# Patient Record
Sex: Male | Born: 1940 | Race: White | Hispanic: No | Marital: Married | Smoking: Never smoker
Health system: Southern US, Community
[De-identification: ages and names within clinical notes are randomized; demographics above are authoritative.]

## PROBLEM LIST (undated history)

## (undated) DIAGNOSIS — T7840XA Allergy, unspecified, initial encounter: Secondary | ICD-10-CM

## (undated) DIAGNOSIS — E119 Type 2 diabetes mellitus without complications: Secondary | ICD-10-CM

## (undated) DIAGNOSIS — M1990 Unspecified osteoarthritis, unspecified site: Secondary | ICD-10-CM

## (undated) DIAGNOSIS — E785 Hyperlipidemia, unspecified: Secondary | ICD-10-CM

## (undated) DIAGNOSIS — I10 Essential (primary) hypertension: Secondary | ICD-10-CM

## (undated) DIAGNOSIS — I82511 Chronic embolism and thrombosis of right femoral vein: Secondary | ICD-10-CM

## (undated) HISTORY — DX: Allergy, unspecified, initial encounter: T78.40XA

## (undated) HISTORY — DX: Unspecified osteoarthritis, unspecified site: M19.90

## (undated) HISTORY — DX: Essential (primary) hypertension: I10

## (undated) HISTORY — DX: Type 2 diabetes mellitus without complications: E11.9

## (undated) HISTORY — DX: Hyperlipidemia, unspecified: E78.5

## (undated) HISTORY — DX: Chronic embolism and thrombosis of right femoral vein: I82.511

---

## 1968-06-03 HISTORY — PX: NASAL SEPTUM SURGERY: SHX37

## 1985-06-03 HISTORY — PX: LUMBAR DISC SURGERY: SHX700

## 1996-06-03 HISTORY — PX: KNEE ARTHROSCOPY: SUR90

## 1997-06-03 HISTORY — PX: BUNIONECTOMY: SHX129

## 1998-06-03 HISTORY — PX: BLADDER STONE REMOVAL: SHX568

## 1998-07-18 ENCOUNTER — Ambulatory Visit (HOSPITAL_COMMUNITY): Admission: RE | Admit: 1998-07-18 | Discharge: 1998-07-18 | Payer: Self-pay | Admitting: Orthopedic Surgery

## 1998-07-18 ENCOUNTER — Encounter: Payer: Self-pay | Admitting: Orthopedic Surgery

## 2000-06-03 HISTORY — PX: PROSTATE SURGERY: SHX751

## 2001-06-03 HISTORY — PX: HERNIA REPAIR: SHX51

## 2009-06-03 HISTORY — PX: OTHER SURGICAL HISTORY: SHX169

## 2011-01-08 ENCOUNTER — Ambulatory Visit (INDEPENDENT_AMBULATORY_CARE_PROVIDER_SITE_OTHER): Payer: Medicare Other | Admitting: Family Medicine

## 2011-01-08 ENCOUNTER — Encounter: Payer: Self-pay | Admitting: Family Medicine

## 2011-01-08 DIAGNOSIS — I10 Essential (primary) hypertension: Secondary | ICD-10-CM

## 2011-01-08 DIAGNOSIS — J309 Allergic rhinitis, unspecified: Secondary | ICD-10-CM

## 2011-01-08 DIAGNOSIS — M8580 Other specified disorders of bone density and structure, unspecified site: Secondary | ICD-10-CM | POA: Insufficient documentation

## 2011-01-08 DIAGNOSIS — M1990 Unspecified osteoarthritis, unspecified site: Secondary | ICD-10-CM | POA: Insufficient documentation

## 2011-01-08 DIAGNOSIS — K219 Gastro-esophageal reflux disease without esophagitis: Secondary | ICD-10-CM | POA: Insufficient documentation

## 2011-01-08 DIAGNOSIS — M949 Disorder of cartilage, unspecified: Secondary | ICD-10-CM

## 2011-01-08 DIAGNOSIS — Z0000 Encounter for general adult medical examination without abnormal findings: Secondary | ICD-10-CM | POA: Insufficient documentation

## 2011-01-08 LAB — CBC WITH DIFFERENTIAL/PLATELET
Basophils Absolute: 0 10*3/uL (ref 0.0–0.1)
Eosinophils Relative: 1.8 % (ref 0.0–5.0)
Hemoglobin: 15.3 g/dL (ref 13.0–17.0)
Lymphocytes Relative: 23.4 % (ref 12.0–46.0)
Monocytes Relative: 7.4 % (ref 3.0–12.0)
Neutro Abs: 4.8 10*3/uL (ref 1.4–7.7)
RDW: 13.6 % (ref 11.5–14.6)
WBC: 7.2 10*3/uL (ref 4.5–10.5)

## 2011-01-08 LAB — BASIC METABOLIC PANEL
CO2: 24 mEq/L (ref 19–32)
Chloride: 103 mEq/L (ref 96–112)
Creatinine, Ser: 1.5 mg/dL (ref 0.4–1.5)
Glucose, Bld: 99 mg/dL (ref 70–99)
Sodium: 138 mEq/L (ref 135–145)

## 2011-01-08 LAB — HEPATIC FUNCTION PANEL
ALT: 19 U/L (ref 0–53)
Alkaline Phosphatase: 82 U/L (ref 39–117)
Bilirubin, Direct: 0 mg/dL (ref 0.0–0.3)
Total Protein: 7.4 g/dL (ref 6.0–8.3)

## 2011-01-08 LAB — LIPID PANEL: Total CHOL/HDL Ratio: 6

## 2011-01-08 NOTE — Progress Notes (Signed)
  Subjective:    Patient ID: Allen Jones, male    DOB: 1940-12-21, 70 y.o.   MRN: [id number]  HPI New to establish care.  Previous MD- Debroah Loop at [facility] (left practice), saw Dr Olena Leatherwood (left practice) and then Dr Brendolyn Patty at [facility].  HTN- chronic problem for pt, well controlled on Benicar and HCTZ.  No CP, SOB, HAs, visual changes, edema.  Osteoarthritis- chronic problem for pt.  Has had back and knee surgery.  takes Celebrex daily w/ good results.  ostenopenia- had bone density done in March and was dx'd w/ osteopenia.  Started on Oscal w/ D daily.  Exercises regularly 2-5x/week.  GERD- chronic problem for pt, well controlled on Prilosec 40mg  daily.  Denies breakthrough sxs.  Seasonal allergies- chronic problem for pt, using Flonase regularly.  Still some mild PND and congestion.   Review of Systems For ROS see HPI     Objective:   Physical Exam  Vitals reviewed. Constitutional: He is oriented to person, place, and time. He appears well-developed and well-nourished. No distress.  HENT:  Head: Normocephalic and atraumatic.       Mildly edematous nasal turbinates TMs normal bilaterally Mild PND  Eyes: Conjunctivae and EOM are normal. Pupils are equal, round, and reactive to light.  Neck: Normal range of motion. Neck supple. No thyromegaly present.  Cardiovascular: Normal rate, regular rhythm, normal heart sounds and intact distal pulses.   No murmur heard. Pulmonary/Chest: Effort normal and breath sounds normal. No respiratory distress.  Abdominal: Soft. Bowel sounds are normal. He exhibits no distension.  Musculoskeletal: He exhibits no edema.  Lymphadenopathy:    He has no cervical adenopathy.  Neurological: He is alert and oriented to person, place, and time. No cranial nerve deficit.  Skin: Skin is warm and dry.  Psychiatric: He has a normal mood and affect. His behavior is normal.          Assessment & Plan:

## 2011-01-08 NOTE — Patient Instructions (Signed)
Please schedule your complete physical at your convenience We'll notify you of your lab results Call with any questions or concerns Welcome!  We're glad to have you!

## 2011-01-09 ENCOUNTER — Telehealth: Payer: Self-pay | Admitting: *Deleted

## 2011-01-09 DIAGNOSIS — E785 Hyperlipidemia, unspecified: Secondary | ICD-10-CM

## 2011-01-09 LAB — PSA: PSA: 1.5 ng/mL (ref <=4.00)

## 2011-01-09 MED ORDER — FENOFIBRATE 160 MG PO TABS: 160.0000 mg | ORAL_TABLET | Freq: Every day | ORAL | Status: DC

## 2011-01-09 NOTE — Telephone Encounter (Signed)
Message copied by Romualdo Bolk on Wed Jan 09, 2011  9:14 AM ------      Message from: Sheliah Hatch      Created: Tue Jan 08, 2011  8:34 PM       Triglycerides are elevated- needs to start fenofibrate 160mg  daily and recheck LFTs in 6-8 weeks.            Total cholesterol is also elevated- this will improve w/ attention to healthy diet and continued regular exercise.  Will repeat labs in 6 months.            Kidney function is slightly elevated- should increase water intake            Remainder of labs look good.

## 2011-01-09 NOTE — Telephone Encounter (Signed)
Left message to call back  

## 2011-01-09 NOTE — Telephone Encounter (Signed)
Left message on machine about results. Rx sent to [facility]. Order placed in epic for labs.

## 2011-01-10 ENCOUNTER — Telehealth: Payer: Self-pay

## 2011-01-10 LAB — VITAMIN D 1,25 DIHYDROXY
Vitamin D 1, 25 (OH)2 Total: 30 pg/mL (ref 18–72)
Vitamin D2 1, 25 (OH)2: <8 pg/mL

## 2011-01-10 NOTE — Telephone Encounter (Signed)
Left message on personally identified voicemail to notify pt labs nl 

## 2011-01-10 NOTE — Telephone Encounter (Signed)
Message copied by Beverely Low on Thu Jan 10, 2011  4:50 PM ------      Message from: Sheliah Hatch      Created: Thu Jan 10, 2011  4:37 PM       Normal Vit D level.

## 2011-01-13 NOTE — Assessment & Plan Note (Signed)
Had bone density earlier this year- will be due again in 2014.  Now on daily Ca+D supplement.  Doing weight bearing exercises.  Will follow.

## 2011-01-13 NOTE — Assessment & Plan Note (Signed)
Chronic problem for pt.  Good relief w/ daily NSAID.  On PPI to prevent stomach erosion.

## 2011-01-13 NOTE — Assessment & Plan Note (Signed)
Fairly well controlled on current meds.  Asymptomatic.  Check labs.  No changes.  Will follow.

## 2011-01-13 NOTE — Assessment & Plan Note (Signed)
Well controlled on current meds.  Asymptomatic.  No changes

## 2011-01-13 NOTE — Assessment & Plan Note (Signed)
Using nasal spray regularly but still having some breakthrough sxs.  Encouraged him to start OTC antihistamine.  Pt expressed understanding and is in agreement w/ plan.

## 2011-03-15 ENCOUNTER — Ambulatory Visit (INDEPENDENT_AMBULATORY_CARE_PROVIDER_SITE_OTHER): Payer: Medicare Other | Admitting: Family Medicine

## 2011-03-15 ENCOUNTER — Encounter: Payer: Self-pay | Admitting: Family Medicine

## 2011-03-15 DIAGNOSIS — Z0000 Encounter for general adult medical examination without abnormal findings: Secondary | ICD-10-CM

## 2011-03-15 DIAGNOSIS — Z23 Encounter for immunization: Secondary | ICD-10-CM

## 2011-03-15 DIAGNOSIS — I10 Essential (primary) hypertension: Secondary | ICD-10-CM

## 2011-03-15 DIAGNOSIS — E781 Pure hyperglyceridemia: Secondary | ICD-10-CM

## 2011-03-15 MED ORDER — OMEPRAZOLE MAGNESIUM 20 MG PO TBEC: 20.0000 mg | DELAYED_RELEASE_TABLET | Freq: Two times a day (BID) | ORAL | Status: DC

## 2011-03-15 MED ORDER — CELECOXIB 100 MG PO CAPS: 100.0000 mg | ORAL_CAPSULE | Freq: Two times a day (BID) | ORAL | Status: DC

## 2011-03-15 MED ORDER — OLMESARTAN MEDOXOMIL 40 MG PO TABS: 40.0000 mg | ORAL_TABLET | Freq: Every day | ORAL | Status: DC

## 2011-03-15 MED ORDER — HYDROCHLOROTHIAZIDE 12.5 MG PO TABS: 12.5000 mg | ORAL_TABLET | Freq: Every day | ORAL | Status: DC

## 2011-03-15 MED ORDER — KETOCONAZOLE 2 % EX CREA: TOPICAL_CREAM | Freq: Every day | CUTANEOUS | Status: DC

## 2011-03-15 NOTE — Progress Notes (Signed)
  Subjective:    Patient ID: Allen Jones, male    DOB: December 22, 1940, 70 y.o.   MRN: [id number]  HPI Here today for CPE.  Risk Factors: HTN- chronic problem, well controlled on HCTZ, benicar. Hypertriglyceridemia- new for pt, did not start fenofibrate as recommended.  Exercising regularly, making healthy food choices.  Prefers to wait 6 months and recheck  Physical Activity: exercising 5x/week at the gym Fall risk: low risk Depression: denies current sxs Hearing: normal to conversational tones, slightly decreased to whispered voice at 6 ft ADL's: independent Cognitive: normal linear thought process, memory and attention intact Home Safety: safe at home, lives w/ wife Height, Weight, BMI, Visual Acuity: see vitals, vision corrected to 20/20 w/ glasses Counseling: UTD on colonoscopy (2008), Pneumovax. Labs Ordered: See A&P Care Plan: See A&P    Review of Systems Patient reports no vision/hearing changes, anorexia, fever ,adenopathy, persistant/recurrent hoarseness, swallowing issues, chest pain, palpitations, edema, persistant/recurrent cough, hemoptysis, dyspnea (rest,exertional, paroxysmal nocturnal), gastrointestinal  bleeding (melena, rectal bleeding), abdominal pain, excessive heart burn, GU symptoms (dysuria, hematuria, voiding/incontinence issues) syncope, focal weakness, memory loss, numbness & tingling, skin/hair/nail changes, depression, anxiety, abnormal bruising/bleeding, musculoskeletal symptoms/signs.     Objective:   Physical Exam BP 125/65  Pulse 65  Temp(Src) 97.5 F (36.4 C) (Oral)  Ht 5' 8.5" (1.74 m)  Wt 176 lb (79.833 kg)  BMI 26.37 kg/m2  General Appearance:    Alert, cooperative, no distress, appears stated age  Head:    Normocephalic, without obvious abnormality, atraumatic  Eyes:    PERRL, conjunctiva/corneas clear, EOM's intact, fundi    benign, both eyes       Ears:    Normal TM's and external ear canals, both ears  Nose:   Nares normal, septum midline,  mucosa normal, no drainage   or sinus tenderness  Throat:   Lips, mucosa, and tongue normal; teeth and gums normal  Neck:   Supple, symmetrical, trachea midline, no adenopathy;       thyroid:  No enlargement/tenderness/nodules  Back:     Symmetric, no curvature, ROM normal, no CVA tenderness  Lungs:     Clear to auscultation bilaterally, respirations unlabored  Chest wall:    No tenderness or deformity  Heart:    Regular rate and rhythm, S1 and S2 normal, no murmur, rub   or gallop  Abdomen:     Soft, non-tender, bowel sounds active all four quadrants,    no masses, no organomegaly  Genitalia:    Normal male without lesion, discharge or tenderness  Rectal:    Normal tone, normal prostate, no masses or tenderness;   guaiac negative stool  Extremities:   Extremities normal, atraumatic, no cyanosis or edema  Pulses:   2+ and symmetric all extremities  Skin:   Skin color, texture, turgor normal, no rashes or lesions  Lymph nodes:   Cervical, supraclavicular, and axillary nodes normal  Neurologic:   CNII-XII intact. Normal strength, sensation and reflexes      throughout          Assessment & Plan:

## 2011-03-15 NOTE — Patient Instructions (Signed)
Follow up in 6 months to recheck cholesterol and blood pressure- do not eat before this appt Keep up the good work on diet and exercise Call with any questions or concerns Happy Holidays!

## 2011-03-19 ENCOUNTER — Encounter: Payer: Self-pay | Admitting: Family Medicine

## 2011-03-19 DIAGNOSIS — E781 Pure hyperglyceridemia: Secondary | ICD-10-CM | POA: Insufficient documentation

## 2011-03-19 NOTE — Assessment & Plan Note (Signed)
Pt dx'd at last visit.  Recommended he start fenofibrate but pt did not pick up meds.  He prefers to work on diet and exercise for next 6 months and reassess.  Will follow closely.

## 2011-03-19 NOTE — Assessment & Plan Note (Signed)
Pt's PE WNL.  UTD on health maintenance.  EKG done- see document for interpretation.  Labs from last visit reviewed.  Anticipatory guidance provided.

## 2011-03-19 NOTE — Assessment & Plan Note (Signed)
Chronic problem.  Well controlled today.  Asymptomatic.  No changes at this time.

## 2011-03-22 ENCOUNTER — Other Ambulatory Visit: Payer: Self-pay | Admitting: *Deleted

## 2011-03-22 MED ORDER — OMEPRAZOLE 40 MG PO CPDR: 40.0000 mg | DELAYED_RELEASE_CAPSULE | Freq: Every day | ORAL | Status: DC

## 2011-03-22 NOTE — Telephone Encounter (Signed)
Pt called and wanted to change omeprazole 20mg  BID to Omeprazole 40mg  QD, MD authorized change and new order was faxed to [facility], pt aware that new RX has been faxed

## 2011-05-10 ENCOUNTER — Ambulatory Visit (INDEPENDENT_AMBULATORY_CARE_PROVIDER_SITE_OTHER): Payer: Medicare Other | Admitting: Family

## 2011-05-10 ENCOUNTER — Encounter: Payer: Self-pay | Admitting: Family

## 2011-05-10 ENCOUNTER — Ambulatory Visit (HOSPITAL_BASED_OUTPATIENT_CLINIC_OR_DEPARTMENT_OTHER)
Admission: RE | Admit: 2011-05-10 | Discharge: 2011-05-10 | Disposition: A | Discharge status: Home / Self Care | Payer: Medicare Other | Source: Ambulatory Visit | Attending: Family | Admitting: Family

## 2011-05-10 ENCOUNTER — Telehealth: Payer: Self-pay | Admitting: Family

## 2011-05-10 VITALS — BP 110/70 | HR 72 | Temp 97.5°F | Resp 20 | Wt 178.0 lb

## 2011-05-10 DIAGNOSIS — R61 Generalized hyperhidrosis: Secondary | ICD-10-CM | POA: Insufficient documentation

## 2011-05-10 DIAGNOSIS — R05 Cough: Secondary | ICD-10-CM | POA: Insufficient documentation

## 2011-05-10 DIAGNOSIS — R059 Cough, unspecified: Secondary | ICD-10-CM | POA: Insufficient documentation

## 2011-05-10 DIAGNOSIS — J40 Bronchitis, not specified as acute or chronic: Secondary | ICD-10-CM

## 2011-05-10 DIAGNOSIS — R509 Fever, unspecified: Secondary | ICD-10-CM

## 2011-05-10 MED ORDER — AZITHROMYCIN 250 MG PO TABS: ORAL_TABLET | ORAL | Status: AC

## 2011-05-10 MED ORDER — BENZONATATE 100 MG PO CAPS: 100.0000 mg | ORAL_CAPSULE | Freq: Three times a day (TID) | ORAL | Status: AC | PRN

## 2011-05-10 NOTE — Telephone Encounter (Signed)
Please call pt and let him know that his chest x-ray is negative.  He should still take zithromax.

## 2011-05-10 NOTE — Patient Instructions (Signed)
Please complete your chest x-ray on the first floor.  Follow up with Dr. Beverely Low in 1-2 weeks. Call if symptoms worsen, or if no improvement in 2-3 days.

## 2011-05-10 NOTE — Progress Notes (Signed)
Subjective:    Patient ID: Allen Jones, male    DOB: January 18, 1941, 70 y.o.   MRN: [id number]  HPI  Mr.  Jones is a 70 yr old male who presents today with chief complaint of cough.  Symptoms started 1 month ago.  Symptoms have waxed and waned. This time he reports that this time he had subjective fever at night (sweats).   Cough is generally dry, other times "chunky."  Lungs feel sore from coughing so much.  He denies significant nasal drainage.  He has only tried some over the counter preps without much improvement. He denies smoking hx.    Review of Systems    see HPI  Past Medical History  Diagnosis Date  . Allergy   . Arthritis   . Hypertension     History   Social History  . Marital Status: Married    Spouse Name: N/A    Number of Children: N/A  . Years of Education: N/A   Occupational History  . Not on file.   Social History Main Topics  . Smoking status: Never Smoker   . Smokeless tobacco: Not on file  . Alcohol Use: No  . Drug Use: No  . Sexually Active: Not on file   Other Topics Concern  . Not on file   Social History Narrative  . No narrative on file    Past Surgical History  Procedure Date  . Nasal septum surgery 1970  . Lumbar disc surgery 1987  . Knee arthroscopy 1998  . Bunionectomy 1999    Right foot  . 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 Hernia repair 2003    left inquinal  . Right knee replacement 2011    (possible antibiotic treatment prior to a dental or medical procedure)    Family History  Problem Relation Age of Onset  . Lung cancer Sister     smoker    Allergies  Allergen Reactions  . Benadryl (Diphenhydramine Hcl)     hyper  . Fish Oil     Due to total knee   . Percocet (Oxycodone-Acetaminophen)     Hallucinations  . Vicodin (Hydrocodone-Acetaminophen)     High BP  . Visine     Pink eye    Current Outpatient Prescriptions on File Prior to Visit  Medication Sig Dispense Refill  . calcium-vitamin D (OSCAL  WITH D) 500-200 MG-UNIT per tablet Take 1 tablet by mouth 2 (two) times daily.        . fluticasone (FLONASE) 50 MCG/ACT nasal spray Place 1 spray into the nose 2 (two) times daily.        . hydrochlorothiazide (HYDRODIURIL) 12.5 MG tablet Take 1 tablet (12.5 mg total) by mouth daily.  90 tablet  4  . hydroxypropyl methylcellulose (ISOPTO TEARS) 2.5 % ophthalmic solution 1 drop.        Marland Kitchen ketoconazole (NIZORAL) 2 % cream Apply topically daily.  150 g  2  . olmesartan (BENICAR) 40 MG tablet Take 1 tablet (40 mg total) by mouth daily.  90 tablet  4  . omeprazole (PRILOSEC) 40 MG capsule Take 1 capsule (40 mg total) by mouth daily.  90 capsule  4  . vitamin B-12 (CYANOCOBALAMIN) 1000 MCG tablet Take 1,000 mcg by mouth daily.        . vitamin E 1000 UNIT capsule Take 1,000 Units by mouth daily.        . fenofibrate 160 MG tablet Take 1 tablet (  160 mg total) by mouth daily.  30 tablet  1    BP 110/70  Pulse 72  Temp(Src) 97.5 F (36.4 C) (Oral)  Resp 20  Wt 178 lb 0.6 oz (80.758 kg)  SpO2 97%    Objective:   Physical Exam  Constitutional: He is oriented to person, place, and time. He appears well-developed and well-nourished. No distress.  HENT:  Right Ear: Tympanic membrane and ear canal normal.  Left Ear: Tympanic membrane and ear canal normal.  Mouth/Throat: Uvula is midline. No oropharyngeal exudate or posterior oropharyngeal edema.  Cardiovascular: Normal rate and regular rhythm.   No murmur heard. Pulmonary/Chest: Effort normal and breath sounds normal. No respiratory distress. He has no wheezes. He has no rales. He exhibits no tenderness.  Neurological: He is alert and oriented to person, place, and time.  Skin: Skin is warm and dry. No erythema.  Psychiatric: He has a normal mood and affect. His behavior is normal. Judgment and thought content normal.          Assessment & Plan:

## 2011-05-12 DIAGNOSIS — J40 Bronchitis, not specified as acute or chronic: Secondary | ICD-10-CM | POA: Insufficient documentation

## 2011-05-12 NOTE — Assessment & Plan Note (Signed)
Due to duration of symptoms a chest x-ray was obtained and this was negative. Will treat with zithromax and prn tessalon.

## 2011-05-13 NOTE — Telephone Encounter (Signed)
Left detailed message on voicemail and to call if any questions. 

## 2011-09-10 ENCOUNTER — Ambulatory Visit: Payer: Medicare Other | Admitting: Family Medicine

## 2011-09-10 ENCOUNTER — Ambulatory Visit (INDEPENDENT_AMBULATORY_CARE_PROVIDER_SITE_OTHER): Payer: Medicare Other | Admitting: Family Medicine

## 2011-09-10 ENCOUNTER — Encounter: Payer: Self-pay | Admitting: Family Medicine

## 2011-09-10 VITALS — BP 130/75 | HR 68 | Temp 97.5°F | Ht 67.5 in | Wt 179.2 lb

## 2011-09-10 DIAGNOSIS — I10 Essential (primary) hypertension: Secondary | ICD-10-CM

## 2011-09-10 DIAGNOSIS — J309 Allergic rhinitis, unspecified: Secondary | ICD-10-CM

## 2011-09-10 DIAGNOSIS — E781 Pure hyperglyceridemia: Secondary | ICD-10-CM

## 2011-09-10 LAB — BASIC METABOLIC PANEL
CO2: 25 mEq/L (ref 19–32)
Calcium: 9.5 mg/dL (ref 8.4–10.5)
Chloride: 103 mEq/L (ref 96–112)
Sodium: 139 mEq/L (ref 135–145)

## 2011-09-10 LAB — LIPID PANEL
HDL: 35.1 mg/dL — ABNORMAL LOW (ref >39.00)
Total CHOL/HDL Ratio: 6
Triglycerides: 316 mg/dL — ABNORMAL HIGH (ref 0.0–149.0)

## 2011-09-10 LAB — LDL CHOLESTEROL, DIRECT: Direct LDL: 118.9 mg/dL

## 2011-09-10 LAB — HEPATIC FUNCTION PANEL
Alkaline Phosphatase: 88 U/L (ref 39–117)
Bilirubin, Direct: 0.1 mg/dL (ref 0.0–0.3)
Total Bilirubin: 0.7 mg/dL (ref 0.3–1.2)
Total Protein: 7.5 g/dL (ref 6.0–8.3)

## 2011-09-10 MED ORDER — FEXOFENADINE HCL 180 MG PO TABS: 180.0000 mg | ORAL_TABLET | Freq: Every day | ORAL | Status: DC

## 2011-09-10 NOTE — Patient Instructions (Signed)
Schedule your complete physical for October We'll notify you of your lab results Start the Allegra daily Continue your meds as directed- you look great! Call with any questions or concerns Happy Birthday!!!

## 2011-09-10 NOTE — Assessment & Plan Note (Signed)
Chronic problem.  Was attempting to control w/ diet and exercise.  Recheck labs to determine if this was successful.

## 2011-09-10 NOTE — Assessment & Plan Note (Signed)
Unchanged.  Script provided for [employer]

## 2011-09-10 NOTE — Progress Notes (Signed)
  Subjective:    Patient ID: Allen Jones, male    DOB: 05-08-1941, 71 y.o.   MRN: [id number]  HPI HTN- chronic problem, on HCTZ and Benicar.  BP is well controlled.  Pt feeling 'great'.  No CP, SOB, HAs, visual changes, edema.    Hypertriglyceride- not currently on meds.  Goal was to work on diet.  Due for labs today.  Seasonal allergies- previously on Allegra.  This worked well for pt.  Would like prescription.  Review of Systems For ROS see HPI     Objective:   Physical Exam  Constitutional: He is oriented to person, place, and time. He appears well-developed and well-nourished. No distress.  HENT:  Head: Normocephalic and atraumatic.  Eyes: Conjunctivae and EOM are normal. Pupils are equal, round, and reactive to light.  Neck: Normal range of motion. Neck supple. No thyromegaly present.  Cardiovascular: Normal rate, regular rhythm, normal heart sounds and intact distal pulses.   No murmur heard. Pulmonary/Chest: Effort normal and breath sounds normal. No respiratory distress.  Abdominal: Soft. Bowel sounds are normal. He exhibits no distension.  Musculoskeletal: He exhibits no edema.  Lymphadenopathy:    He has no cervical adenopathy.  Neurological: He is alert and oriented to person, place, and time. No cranial nerve deficit.  Skin: Skin is warm and dry.  Psychiatric: He has a normal mood and affect. His behavior is normal.          Assessment & Plan:

## 2011-09-10 NOTE — Assessment & Plan Note (Signed)
Chronic problem, well controlled.  Asymptomatic.  No changes. 

## 2011-09-13 ENCOUNTER — Ambulatory Visit: Payer: Medicare Other | Admitting: Family Medicine

## 2011-09-19 ENCOUNTER — Ambulatory Visit: Payer: Medicare Other | Admitting: Family Medicine

## 2011-10-15 ENCOUNTER — Telehealth: Payer: Self-pay | Admitting: Family Medicine

## 2011-10-15 MED ORDER — KETOCONAZOLE 2 % EX CREA: TOPICAL_CREAM | Freq: Every day | CUTANEOUS | Status: DC

## 2011-10-15 NOTE — Telephone Encounter (Signed)
Refill: Ketoconazole cream 2%. 90 day supply

## 2011-10-15 NOTE — Telephone Encounter (Signed)
rx sent to pharmacy by e-script  

## 2011-10-17 ENCOUNTER — Telehealth: Payer: Self-pay | Admitting: Family Medicine

## 2011-10-17 NOTE — Telephone Encounter (Signed)
Called pt to advise that he needs to contact his insurance company to see what medication they will pay for concerning his high trigs of 316, left vm to call office back

## 2011-10-17 NOTE — Telephone Encounter (Signed)
Patient called and stated his insurance would not cover meds and he can not afford it, he is refusing Fenofibrate Can call patient back if you need to he says you have his ph#

## 2011-11-07 NOTE — Telephone Encounter (Signed)
.  left message to have patient return my call.  

## 2011-11-14 NOTE — Telephone Encounter (Signed)
.  left message to have patient return my call. MD Beverely Low made aware verbally that pt has been contacted several times with no resolve, last notation listed in lab results noted pt was to be verfiying how much this will cost him out of pocket with [facility].

## 2012-03-10 ENCOUNTER — Encounter: Payer: Medicare Other | Admitting: Family Medicine

## 2012-04-07 ENCOUNTER — Ambulatory Visit (INDEPENDENT_AMBULATORY_CARE_PROVIDER_SITE_OTHER): Payer: Medicare Other | Admitting: Family Medicine

## 2012-04-07 ENCOUNTER — Encounter: Payer: Self-pay | Admitting: Family Medicine

## 2012-04-07 VITALS — BP 130/80 | HR 61 | Temp 98.6°F | Resp 16 | Ht 68.5 in | Wt 180.1 lb

## 2012-04-07 DIAGNOSIS — N5089 Other specified disorders of the male genital organs: Secondary | ICD-10-CM

## 2012-04-07 DIAGNOSIS — Z125 Encounter for screening for malignant neoplasm of prostate: Secondary | ICD-10-CM

## 2012-04-07 DIAGNOSIS — Z23 Encounter for immunization: Secondary | ICD-10-CM

## 2012-04-07 DIAGNOSIS — K219 Gastro-esophageal reflux disease without esophagitis: Secondary | ICD-10-CM

## 2012-04-07 DIAGNOSIS — I10 Essential (primary) hypertension: Secondary | ICD-10-CM

## 2012-04-07 DIAGNOSIS — G4700 Insomnia, unspecified: Secondary | ICD-10-CM

## 2012-04-07 DIAGNOSIS — E781 Pure hyperglyceridemia: Secondary | ICD-10-CM

## 2012-04-07 DIAGNOSIS — Z0000 Encounter for general adult medical examination without abnormal findings: Secondary | ICD-10-CM

## 2012-04-07 DIAGNOSIS — N508 Other specified disorders of male genital organs: Secondary | ICD-10-CM

## 2012-04-07 LAB — HEPATIC FUNCTION PANEL
ALT: 31 U/L (ref 0–53)
Bilirubin, Direct: 0.1 mg/dL (ref 0.0–0.3)
Total Bilirubin: 0.7 mg/dL (ref 0.3–1.2)

## 2012-04-07 LAB — BASIC METABOLIC PANEL
BUN: 23 mg/dL (ref 6–23)
Calcium: 10 mg/dL (ref 8.4–10.5)
Creatinine, Ser: 1.5 mg/dL (ref 0.4–1.5)
GFR: 47.94 mL/min — ABNORMAL LOW (ref >60.00)

## 2012-04-07 LAB — CBC WITH DIFFERENTIAL/PLATELET
Basophils Absolute: 0 10*3/uL (ref 0.0–0.1)
Basophils Relative: 0.7 % (ref 0.0–3.0)
Eosinophils Absolute: 0.2 10*3/uL (ref 0.0–0.7)
Hemoglobin: 15.8 g/dL (ref 13.0–17.0)
Lymphs Abs: 2 10*3/uL (ref 0.7–4.0)
MCHC: 32.7 g/dL (ref 30.0–36.0)
MCV: 98.7 fl (ref 78.0–100.0)
Monocytes Absolute: 0.6 10*3/uL (ref 0.1–1.0)
Neutro Abs: 4.4 10*3/uL (ref 1.4–7.7)
RBC: 4.89 Mil/uL (ref 4.22–5.81)
RDW: 13.5 % (ref 11.5–14.6)

## 2012-04-07 LAB — LIPID PANEL
Cholesterol: 254 mg/dL — ABNORMAL HIGH (ref 0–200)
HDL: 32 mg/dL — ABNORMAL LOW (ref >39.00)
Triglycerides: 482 mg/dL — ABNORMAL HIGH (ref 0.0–149.0)
VLDL: 96.4 mg/dL — ABNORMAL HIGH (ref 0.0–40.0)

## 2012-04-07 LAB — TSH: TSH: 2.06 u[IU]/mL (ref 0.35–5.50)

## 2012-04-07 MED ORDER — KETOCONAZOLE 2 % EX CREA: TOPICAL_CREAM | Freq: Every day | CUTANEOUS | Status: AC

## 2012-04-07 MED ORDER — OLMESARTAN MEDOXOMIL 40 MG PO TABS: 40.0000 mg | ORAL_TABLET | Freq: Every day | ORAL | Status: DC

## 2012-04-07 MED ORDER — OMEPRAZOLE 40 MG PO CPDR: 40.0000 mg | DELAYED_RELEASE_CAPSULE | Freq: Every day | ORAL | Status: DC

## 2012-04-07 MED ORDER — CELECOXIB 100 MG PO CAPS: 100.0000 mg | ORAL_CAPSULE | Freq: Every day | ORAL | Status: DC

## 2012-04-07 MED ORDER — HYDROCHLOROTHIAZIDE 12.5 MG PO TABS: 12.5000 mg | ORAL_TABLET | Freq: Every day | ORAL | Status: DC

## 2012-04-07 MED ORDER — TEMAZEPAM 15 MG PO CAPS: 15.0000 mg | ORAL_CAPSULE | Freq: Every evening | ORAL | Status: DC | PRN

## 2012-04-07 MED ORDER — TEMAZEPAM 15 MG PO CAPS: 30.0000 mg | ORAL_CAPSULE | Freq: Every evening | ORAL | Status: DC | PRN

## 2012-04-07 MED ORDER — RANITIDINE HCL 150 MG PO TABS: 150.0000 mg | ORAL_TABLET | Freq: Two times a day (BID) | ORAL | Status: DC

## 2012-04-07 NOTE — Progress Notes (Signed)
  Subjective:    Patient ID: Allen Jones, male    DOB: [birth year], 71 y.o.   MRN: [id number]  HPI Here today for CPE.  Risk Factors: HTN- chronic problem, well controlled today.  On HCTZ, Benicar.  Denies CP, SOB, HAs, visual changes, edema Hypertriglyceridemia- chronic problem, no longer on Fenofibrate.  Attempting to control w/ diet and exercise. Physical Activity: exercising regularly Fall Risk: low risk Depression: no current sxs Hearing: normal to conversational tones, decreased to whispered voice at 6 ft ADL's: independent Cognitive: normal linear thought process, memory and attention intact Home Safety: safe at home, lives w/ wife Height, Weight, BMI, Visual Acuity: see vitals, vision corrected to 20/20 w/ glasses Counseling: UTD on colonoscopy, reports UTD on pneumovax.  Due for flu shot. Labs Ordered: See A&P Care Plan: See A&P    Review of Systems Patient reports no vision/hearing changes, anorexia, fever ,adenopathy, persistant/recurrent hoarseness, swallowing issues, chest pain, palpitations, edema, persistant/recurrent cough, hemoptysis, dyspnea (rest,exertional, paroxysmal nocturnal), gastrointestinal  bleeding (melena, rectal bleeding), abdominal pain, excessive heart burn, GU symptoms (dysuria, hematuria, voiding/incontinence issues) syncope, focal weakness, memory loss, numbness & tingling, skin/hair/nail changes, depression, anxiety, abnormal bruising/bleeding, musculoskeletal symptoms/signs.   + insomnia- previously was on Temazepam 30mg , would like to restart but would like to take 2 15mg  tabs to adjust dose as needed.    Objective:   Physical Exam BP 130/80  Pulse 61  Temp 98.6 F (37 C) (Oral)  Resp 16  Ht 5' 8.5" (1.74 m)  Wt 180 lb 2 oz (81.704 kg)  BMI 26.99 kg/m2  SpO2 95%  General Appearance:    Alert, cooperative, no distress, appears stated age  Head:    Normocephalic, without obvious abnormality, atraumatic  Eyes:    PERRL, conjunctiva/corneas  clear, EOM's intact, fundi    benign, both eyes       Ears:    Normal TM's and external ear canals, both ears  Nose:   Nares normal, septum midline, mucosa normal, no drainage   or sinus tenderness  Throat:   Lips, mucosa, and tongue normal; teeth and gums normal  Neck:   Supple, symmetrical, trachea midline, no adenopathy;       thyroid:  No enlargement/tenderness/nodules  Back:     Symmetric, no curvature, ROM normal, no CVA tenderness  Lungs:     Clear to auscultation bilaterally, respirations unlabored  Chest wall:    No tenderness or deformity  Heart:    Regular rate and rhythm, S1 and S2 normal, no murmur, rub   or gallop  Abdomen:     Soft, non-tender, bowel sounds active all four quadrants,    no masses, no organomegaly  Genitalia:    Normal male without lesion, discharge or tenderness, + testicular mass on L- nontender  Rectal:    Normal tone, normal prostate, no masses or tenderness  Extremities:   Extremities normal, atraumatic, no cyanosis or edema  Pulses:   2+ and symmetric all extremities  Skin:   Skin color, texture, turgor normal, no rashes or lesions  Lymph nodes:   Cervical, supraclavicular, and axillary nodes normal  Neurologic:   CNII-XII intact. Normal strength, sensation and reflexes      throughout          Assessment & Plan:

## 2012-04-07 NOTE — Patient Instructions (Addendum)
Follow up in 6 months to recheck BP and cholesterol Someone will call you with your testicular US to determine whether we need to do anything else about the lump Start the Temazepam as needed for sleep We'll notify you of your lab results and make any changes if needed Keep up the good work!  You look great! Happy Fall!!!

## 2012-04-10 ENCOUNTER — Ambulatory Visit (HOSPITAL_BASED_OUTPATIENT_CLINIC_OR_DEPARTMENT_OTHER): Payer: Medicare Other

## 2012-04-19 NOTE — Assessment & Plan Note (Signed)
New.  Not tender.  Get [place] to assess.  Will refer to uro prn.

## 2012-04-19 NOTE — Assessment & Plan Note (Signed)
Refill PPI 

## 2012-04-19 NOTE — Assessment & Plan Note (Signed)
New to provider.  Pt was previously on Temazepam 30mg  tabs but would like 15mg  tabs so he can adjust the dose as needed.  Script provided.

## 2012-04-19 NOTE — Assessment & Plan Note (Signed)
Pt's PE WNL w/ exception of testicular mass.  UTD on health maintenance.  Check labs.  Anticipatory guidance provided.

## 2012-04-19 NOTE — Assessment & Plan Note (Signed)
Chronic problem.  Pt stopped fenofibrate in hopes of controlling w/ diet and exercise.  Check labs.  Restart meds prn.

## 2012-04-19 NOTE — Assessment & Plan Note (Signed)
Chronic problem, adequate control.  Asymptomatic.  Check labs.  No anticipated changes. 

## 2012-04-21 ENCOUNTER — Ambulatory Visit (HOSPITAL_BASED_OUTPATIENT_CLINIC_OR_DEPARTMENT_OTHER)
Admission: RE | Admit: 2012-04-21 | Discharge: 2012-04-21 | Disposition: A | Discharge status: Home / Self Care | Payer: Medicare Other | Source: Ambulatory Visit | Attending: Family Medicine | Admitting: Family Medicine

## 2012-04-21 DIAGNOSIS — N5089 Other specified disorders of the male genital organs: Secondary | ICD-10-CM

## 2012-04-21 DIAGNOSIS — N508 Other specified disorders of male genital organs: Secondary | ICD-10-CM | POA: Insufficient documentation

## 2012-08-10 ENCOUNTER — Encounter: Payer: Self-pay | Admitting: Family Medicine

## 2012-08-10 ENCOUNTER — Telehealth: Payer: Self-pay | Admitting: Family Medicine

## 2012-08-10 DIAGNOSIS — G4700 Insomnia, unspecified: Secondary | ICD-10-CM

## 2012-08-10 NOTE — Telephone Encounter (Signed)
NOTE NEW PHARMACY  refill Temazepam 15MG  Capsules MYL #60 no instructions listed last fill 2.11.14

## 2012-08-10 NOTE — Telephone Encounter (Signed)
Last seen and filled 04/07/12 #60 with 3 refills. Please advise     KP

## 2012-08-11 MED ORDER — TEMAZEPAM 15 MG PO CAPS: 30.0000 mg | ORAL_CAPSULE | Freq: Every evening | ORAL | Status: DC | PRN

## 2012-08-11 NOTE — Telephone Encounter (Signed)
Please advise on RF request.  Last OV:04-07-12 and last RF:07-14-12.//AB/CMA

## 2012-08-11 NOTE — Telephone Encounter (Signed)
Ok to refill #60, 3 refills- pt takes 2 tabs nightly

## 2012-08-11 NOTE — Telephone Encounter (Signed)
Rx printed and will be faxed to pharmacy([facility]).//AB/CMA

## 2012-08-17 MED ORDER — TEMAZEPAM 15 MG PO CAPS: 30.0000 mg | ORAL_CAPSULE | Freq: Every evening | ORAL | Status: DC | PRN

## 2012-08-17 NOTE — Telephone Encounter (Signed)
Rx printed and will be sent to the pharmacy by fax.//AB/CMA

## 2012-10-12 ENCOUNTER — Encounter: Payer: Self-pay | Admitting: Lab

## 2012-10-13 ENCOUNTER — Ambulatory Visit (INDEPENDENT_AMBULATORY_CARE_PROVIDER_SITE_OTHER): Payer: Medicare Other | Admitting: Family Medicine

## 2012-10-13 ENCOUNTER — Encounter: Payer: Self-pay | Admitting: Family Medicine

## 2012-10-13 VITALS — BP 120/80 | HR 62 | Temp 97.7°F | Ht 67.5 in | Wt 177.2 lb

## 2012-10-13 DIAGNOSIS — I10 Essential (primary) hypertension: Secondary | ICD-10-CM

## 2012-10-13 DIAGNOSIS — G4700 Insomnia, unspecified: Secondary | ICD-10-CM

## 2012-10-13 DIAGNOSIS — E781 Pure hyperglyceridemia: Secondary | ICD-10-CM

## 2012-10-13 MED ORDER — OLMESARTAN MEDOXOMIL 40 MG PO TABS: 40.0000 mg | ORAL_TABLET | Freq: Every day | ORAL | Status: DC

## 2012-10-13 MED ORDER — RANITIDINE HCL 150 MG PO TABS: 150.0000 mg | ORAL_TABLET | Freq: Two times a day (BID) | ORAL | Status: DC

## 2012-10-13 MED ORDER — TEMAZEPAM 15 MG PO CAPS: 30.0000 mg | ORAL_CAPSULE | Freq: Every evening | ORAL | Status: DC | PRN

## 2012-10-13 MED ORDER — OMEPRAZOLE 40 MG PO CPDR: 40.0000 mg | DELAYED_RELEASE_CAPSULE | Freq: Every day | ORAL | Status: DC

## 2012-10-13 MED ORDER — HYDROCHLOROTHIAZIDE 12.5 MG PO TABS: 12.5000 mg | ORAL_TABLET | Freq: Every day | ORAL | Status: DC

## 2012-10-13 MED ORDER — CELECOXIB 100 MG PO CAPS: 100.0000 mg | ORAL_CAPSULE | Freq: Every day | ORAL | Status: DC

## 2012-10-13 NOTE — Assessment & Plan Note (Signed)
Refill provided on temazepam

## 2012-10-13 NOTE — Progress Notes (Signed)
  Subjective:    Patient ID: Allen Jones, male    DOB: 04-15-1941, 72 y.o.   MRN: [id number]  HPI HTN- chronic problem, well controlled today.  On HCTZ, Benicar.  Denies CP, SOB, HAs, visual changes, edema.  Continues to exercise regularly.  Hyperlipidemia- chronic problem.  Not currently on meds despite very elevated trigs at last check.  Never started his fenofibrate.  Attempting to control w/ diet and exercise.  Denies abd pain, N/V, myalgias.   Review of Systems For ROS see HPI     Objective:   Physical Exam  Vitals reviewed. Constitutional: He is oriented to person, place, and time. He appears well-developed and well-nourished. No distress.  HENT:  Head: Normocephalic and atraumatic.  Eyes: Conjunctivae and EOM are normal. Pupils are equal, round, and reactive to light.  Neck: Normal range of motion. Neck supple. No thyromegaly present.  Cardiovascular: Normal rate, regular rhythm, normal heart sounds and intact distal pulses.   No murmur heard. Pulmonary/Chest: Effort normal and breath sounds normal. No respiratory distress.  Abdominal: Soft. Bowel sounds are normal. He exhibits no distension.  Musculoskeletal: He exhibits no edema.  Lymphadenopathy:    He has no cervical adenopathy.  Neurological: He is alert and oriented to person, place, and time. No cranial nerve deficit.  Skin: Skin is warm and dry.  Psychiatric: He has a normal mood and affect. His behavior is normal.          Assessment & Plan:

## 2012-10-13 NOTE — Assessment & Plan Note (Signed)
Chronic problem.  Well controlled.  Asymptomatic.  Check labs.  No anticipated changes. 

## 2012-10-13 NOTE — Assessment & Plan Note (Signed)
Chronic problem.  Pt has not started meds as directed.  Exercising regularly.  Check labs- revisit the medication issue as needed

## 2012-10-13 NOTE — Patient Instructions (Addendum)
Schedule your complete physical in November We'll notify you of your lab results and make any changes if needed Keep up the good work!  You look great! Happy Spring!

## 2012-11-04 ENCOUNTER — Other Ambulatory Visit (INDEPENDENT_AMBULATORY_CARE_PROVIDER_SITE_OTHER): Payer: Medicare Other

## 2012-11-04 DIAGNOSIS — E781 Pure hyperglyceridemia: Secondary | ICD-10-CM

## 2012-11-04 DIAGNOSIS — I10 Essential (primary) hypertension: Secondary | ICD-10-CM

## 2012-11-04 LAB — BASIC METABOLIC PANEL
BUN: 22 mg/dL (ref 6–23)
Calcium: 10 mg/dL (ref 8.4–10.5)
Creatinine, Ser: 1.5 mg/dL (ref 0.4–1.5)
GFR: 47.15 mL/min — ABNORMAL LOW (ref >60.00)

## 2012-11-04 LAB — HEPATIC FUNCTION PANEL
Albumin: 4.1 g/dL (ref 3.5–5.2)
Total Bilirubin: 0.9 mg/dL (ref 0.3–1.2)

## 2012-11-04 LAB — LIPID PANEL
Cholesterol: 238 mg/dL — ABNORMAL HIGH (ref 0–200)
Total CHOL/HDL Ratio: 8
Triglycerides: 364 mg/dL — ABNORMAL HIGH (ref 0.0–149.0)
VLDL: 72.8 mg/dL — ABNORMAL HIGH (ref 0.0–40.0)

## 2012-11-04 NOTE — Progress Notes (Signed)
LABS ONLY  

## 2012-12-11 ENCOUNTER — Telehealth: Payer: Self-pay | Admitting: *Deleted

## 2012-12-11 DIAGNOSIS — G4700 Insomnia, unspecified: Secondary | ICD-10-CM

## 2012-12-11 MED ORDER — TEMAZEPAM 15 MG PO CAPS: 30.0000 mg | ORAL_CAPSULE | Freq: Every evening | ORAL | Status: DC | PRN

## 2012-12-11 NOTE — Telephone Encounter (Signed)
Rx sent 

## 2012-12-11 NOTE — Telephone Encounter (Signed)
10-13-12 Last OV, Last refilled #60 2

## 2012-12-11 NOTE — Telephone Encounter (Signed)
Pt can have #60, 2 refills

## 2012-12-21 ENCOUNTER — Encounter: Payer: Self-pay | Admitting: Family Medicine

## 2012-12-22 MED ORDER — FLUTICASONE PROPIONATE 50 MCG/ACT NA SUSP: 1.0000 | Freq: Two times a day (BID) | NASAL | Status: DC

## 2012-12-22 NOTE — Telephone Encounter (Signed)
Rx sent to the pharmacy by e-script.//AB/CMA 

## 2013-01-06 ENCOUNTER — Other Ambulatory Visit: Payer: Self-pay

## 2013-01-06 ENCOUNTER — Encounter: Payer: Self-pay | Admitting: Family Medicine

## 2013-01-11 NOTE — Telephone Encounter (Signed)
Please advise.//AB/CMA 

## 2013-02-16 ENCOUNTER — Telehealth: Payer: Self-pay | Admitting: *Deleted

## 2013-02-16 DIAGNOSIS — G4700 Insomnia, unspecified: Secondary | ICD-10-CM

## 2013-02-16 NOTE — Telephone Encounter (Signed)
Rx refill request for Temazepam 15 mg Last ov 10/13/12 Last date filled 12/11/12 No UDS  No contract on file  Please Advise   Ag cma

## 2013-02-17 ENCOUNTER — Other Ambulatory Visit: Payer: Self-pay | Admitting: General Practice

## 2013-02-17 MED ORDER — TEMAZEPAM 15 MG PO CAPS: 30.0000 mg | ORAL_CAPSULE | Freq: Every evening | ORAL | Status: DC | PRN

## 2013-02-17 NOTE — Telephone Encounter (Signed)
Ok for #60, 2 refills but needs to sign agreement and do UDS if not done

## 2013-02-17 NOTE — Telephone Encounter (Signed)
Med placed up front for pick up, with a Controlled Substance Contract. Pt notified.

## 2013-03-22 ENCOUNTER — Telehealth: Payer: Self-pay | Admitting: Family Medicine

## 2013-03-22 DIAGNOSIS — G4700 Insomnia, unspecified: Secondary | ICD-10-CM

## 2013-03-22 MED ORDER — TEMAZEPAM 15 MG PO CAPS: 30.0000 mg | ORAL_CAPSULE | Freq: Every evening | ORAL | Status: DC | PRN

## 2013-03-22 NOTE — Telephone Encounter (Signed)
Patient needs a refill on his temazepam (RESTORIL) 15 MG sent to [facility]. He does not have enough to last him until his appointment in  December. Please advise.

## 2013-03-22 NOTE — Telephone Encounter (Signed)
Med filled. Due to pt having an appt with T[facility] in Dec.

## 2013-03-23 NOTE — Telephone Encounter (Signed)
PA began for temazepam. Paperwork faxed on 03/23/13.

## 2013-03-25 MED ORDER — TEMAZEPAM 15 MG PO CAPS: 30.0000 mg | ORAL_CAPSULE | Freq: Every evening | ORAL | Status: DC | PRN

## 2013-03-25 NOTE — Addendum Note (Signed)
Addended by: Jackson Latino on: 03/25/2013 10:53 AM   Modules accepted: Orders

## 2013-03-25 NOTE — Telephone Encounter (Signed)
PA approved from 03-23-13 to 03/23/14. Med resent and papers sent to be scanned into chart.

## 2013-04-08 ENCOUNTER — Other Ambulatory Visit: Payer: Self-pay

## 2013-05-24 ENCOUNTER — Encounter: Payer: Self-pay | Admitting: Lab

## 2013-05-24 ENCOUNTER — Telehealth: Payer: Self-pay

## 2013-05-24 NOTE — Telephone Encounter (Addendum)
Left message for call back Non identifiable  Medication List and allergies:  Reviewed and updated  90 day supply/mail order: [facility] Local prescriptions: [facility] [address] [place]  Immunizations due: admin flu vaccine upon arrival  A/P:   No changes to FH or PSH or personal hx Tdap--> 5 years ago Never had shingles vaccine [facility]--2010--per patient PSA--04/2012--1.69  To Discuss with Provider: Not at this time.

## 2013-05-25 ENCOUNTER — Ambulatory Visit (INDEPENDENT_AMBULATORY_CARE_PROVIDER_SITE_OTHER): Payer: Medicare Other | Admitting: Family Medicine

## 2013-05-25 ENCOUNTER — Encounter: Payer: Self-pay | Admitting: Lab

## 2013-05-25 ENCOUNTER — Encounter: Payer: Self-pay | Admitting: Family Medicine

## 2013-05-25 VITALS — BP 122/78 | HR 72 | Temp 98.4°F | Resp 16 | Ht 68.25 in | Wt 179.4 lb

## 2013-05-25 DIAGNOSIS — M8580 Other specified disorders of bone density and structure, unspecified site: Secondary | ICD-10-CM

## 2013-05-25 DIAGNOSIS — I10 Essential (primary) hypertension: Secondary | ICD-10-CM

## 2013-05-25 DIAGNOSIS — M899 Disorder of bone, unspecified: Secondary | ICD-10-CM

## 2013-05-25 DIAGNOSIS — J309 Allergic rhinitis, unspecified: Secondary | ICD-10-CM

## 2013-05-25 DIAGNOSIS — Z0000 Encounter for general adult medical examination without abnormal findings: Secondary | ICD-10-CM

## 2013-05-25 LAB — BASIC METABOLIC PANEL
BUN: 21 mg/dL (ref 6–23)
CO2: 27 mEq/L (ref 19–32)
Calcium: 9.8 mg/dL (ref 8.4–10.5)
Chloride: 101 mEq/L (ref 96–112)
Creatinine, Ser: 1.4 mg/dL (ref 0.4–1.5)

## 2013-05-25 LAB — CBC WITH DIFFERENTIAL/PLATELET
Basophils Absolute: 0 10*3/uL (ref 0.0–0.1)
Basophils Relative: 0.6 % (ref 0.0–3.0)
Eosinophils Absolute: 0.2 10*3/uL (ref 0.0–0.7)
HCT: 48.1 % (ref 39.0–52.0)
Hemoglobin: 16.3 g/dL (ref 13.0–17.0)
Lymphs Abs: 1.8 10*3/uL (ref 0.7–4.0)
MCHC: 33.8 g/dL (ref 30.0–36.0)
Monocytes Relative: 7.7 % (ref 3.0–12.0)
Neutro Abs: 4.6 10*3/uL (ref 1.4–7.7)
RBC: 5.05 Mil/uL (ref 4.22–5.81)
RDW: 13.3 % (ref 11.5–14.6)

## 2013-05-25 LAB — TSH: TSH: 3.19 u[IU]/mL (ref 0.35–5.50)

## 2013-05-25 LAB — LIPID PANEL
Cholesterol: 206 mg/dL — ABNORMAL HIGH (ref 0–200)
Total CHOL/HDL Ratio: 7
Triglycerides: 312 mg/dL — ABNORMAL HIGH (ref 0.0–149.0)

## 2013-05-25 LAB — HEPATIC FUNCTION PANEL
ALT: 26 U/L (ref 0–53)
Bilirubin, Direct: 0.1 mg/dL (ref 0.0–0.3)
Total Protein: 7.2 g/dL (ref 6.0–8.3)

## 2013-05-25 NOTE — Progress Notes (Signed)
   Subjective:    Patient ID: Allen Jones, male    DOB: 1941-04-29, 72 y.o.   MRN: [id number]  HPI Here today for CPE.  Risk Factors: HTN- chronic problem, on Benicar, HCTZ w/ good control.  No CP, SOB, HAs, visual changes, edema. Osteopenia- on Ca+Vit D daily.  Last DEXA 2 yrs ago.  Pt prefers to wait until next year to repeat  Allergic Rhinitis- on steroid nasal spray which improves congestion but worsens PND and hoarseness Physical Activity: exercising regularly Fall risk: low risk Depression: denies current sxs Hearing: decreased to conversational tones ADL's: independent Cognitive: normal linear thought process, memory and attention intact Home Safety: safe at home, lives w/ wife Height, Weight, BMI, Visual Acuity: see vitals, vision corrected to 20/20 w/ glasses Counseling: UTD on colonoscopy Labs Ordered: See A&P Care Plan: See A&P    Review of Systems Patient reports no vision/hearing changes, anorexia, fever ,adenopathy, persistant/recurrent hoarseness, swallowing issues, chest pain, palpitations, edema, persistant/recurrent cough, hemoptysis, dyspnea (rest,exertional, paroxysmal nocturnal), gastrointestinal  bleeding (melena, rectal bleeding), abdominal pain, excessive heart burn, GU symptoms (dysuria, hematuria, voiding/incontinence issues) syncope, focal weakness, memory loss, numbness & tingling, skin/hair/nail changes, depression, anxiety, abnormal bruising/bleeding, musculoskeletal symptoms/signs.     Objective:   Physical Exam BP 122/78  Pulse 72  Temp(Src) 98.4 F (36.9 C) (Oral)  Resp 16  Ht 5' 8.25" (1.734 m)  Wt 179 lb 6 oz (81.364 kg)  BMI 27.06 kg/m2  SpO2 95%  General Appearance:    Alert, cooperative, no distress, appears stated age  Head:    Normocephalic, without obvious abnormality, atraumatic  Eyes:    PERRL, conjunctiva/corneas clear, EOM's intact, fundi    benign, both eyes       Ears:    Normal TM's and external ear canals, both ears  Nose:    Nares normal, septum midline, mucosa normal, no drainage   or sinus tenderness  Throat:   Lips, mucosa, and tongue normal; teeth and gums normal  Neck:   Supple, symmetrical, trachea midline, no adenopathy;       thyroid:  No enlargement/tenderness/nodules  Back:     Symmetric, no curvature, ROM normal, no CVA tenderness  Lungs:     Clear to auscultation bilaterally, respirations unlabored  Chest wall:    No tenderness or deformity  Heart:    Regular rate and rhythm, S1 and S2 normal, no murmur, rub   or gallop  Abdomen:     Soft, non-tender, bowel sounds active all four quadrants,    no masses, no organomegaly  Genitalia:    Normal male without lesion, discharge or tenderness  Rectal:    Normal tone, normal prostate, no masses or tenderness  Extremities:   Extremities normal, atraumatic, no cyanosis or edema  Pulses:   2+ and symmetric all extremities  Skin:   Skin color, texture, turgor normal, no rashes or lesions  Lymph nodes:   Cervical, supraclavicular, and axillary nodes normal  Neurologic:   CNII-XII intact. Normal strength, sensation and reflexes      throughout          Assessment & Plan:

## 2013-05-25 NOTE — Progress Notes (Signed)
Pre visit review using our clinic review tool, if applicable. No additional management support is needed unless otherwise documented below in the visit note. 

## 2013-05-25 NOTE — Patient Instructions (Signed)
Follow up in 6 months to recheck BP We'll notify you of your lab results and make any changes if needed Add Claritin or Zyrtec to the nasal spray Call with any questions or concerns Keep up the good work! Happy Holidays!!!

## 2013-05-25 NOTE — Assessment & Plan Note (Signed)
Pt's PE WNL.  UTD on colonoscopy.  Check labs.  Anticipatory guidance provided.  

## 2013-05-25 NOTE — Assessment & Plan Note (Signed)
Chronic problem.  Adequate control.  Asymptomatic.  Check labs.  No anticipated med changes.  Will follow. 

## 2013-05-25 NOTE — Assessment & Plan Note (Signed)
Chronic problem.  Pt would like to hold on DEXA to next year.  Continue Ca and Vit D.  Will follow.

## 2013-05-25 NOTE — Assessment & Plan Note (Signed)
Chronic problem.  Doing well on nasal steroid.  Add OTC antihistamine.  Reviewed supportive care and red flags that should prompt return.  Pt expressed understanding and is in agreement w/ plan.

## 2013-05-26 ENCOUNTER — Other Ambulatory Visit: Payer: Self-pay | Admitting: General Practice

## 2013-05-26 ENCOUNTER — Ambulatory Visit: Payer: Medicare Other

## 2013-05-26 ENCOUNTER — Encounter: Payer: Self-pay | Admitting: Family Medicine

## 2013-05-26 DIAGNOSIS — R7309 Other abnormal glucose: Secondary | ICD-10-CM

## 2013-05-26 MED ORDER — FENOFIBRATE 160 MG PO TABS: 160.0000 mg | ORAL_TABLET | Freq: Every day | ORAL | Status: DC

## 2013-05-28 LAB — VITAMIN D 1,25 DIHYDROXY: Vitamin D2 1, 25 (OH)2: <8 pg/mL

## 2013-05-28 LAB — HEMOGLOBIN A1C: Hgb A1c MFr Bld: 6.9 % — ABNORMAL HIGH (ref 4.6–6.5)

## 2013-05-29 ENCOUNTER — Encounter: Payer: Self-pay | Admitting: Family Medicine

## 2013-05-31 ENCOUNTER — Encounter: Payer: Self-pay | Admitting: Family Medicine

## 2013-06-01 MED ORDER — GLIPIZIDE ER 2.5 MG PO TB24: 2.5000 mg | ORAL_TABLET | Freq: Every day | ORAL | Status: DC

## 2013-06-01 NOTE — Telephone Encounter (Signed)
Med filled to [facility] as requested, and fenofibrate removed from med list.

## 2013-06-09 ENCOUNTER — Encounter: Payer: Self-pay | Admitting: Family Medicine

## 2013-06-09 DIAGNOSIS — G4700 Insomnia, unspecified: Secondary | ICD-10-CM

## 2013-06-10 MED ORDER — TEMAZEPAM 15 MG PO CAPS
30.0000 mg | ORAL_CAPSULE | Freq: Every evening | ORAL | Status: DC | PRN
Start: 2013-06-10 — End: 2013-10-12

## 2013-06-14 ENCOUNTER — Encounter: Payer: Self-pay | Admitting: Family Medicine

## 2013-08-07 ENCOUNTER — Encounter: Payer: Self-pay | Admitting: Family Medicine

## 2013-08-23 ENCOUNTER — Ambulatory Visit: Payer: Medicare Other | Admitting: Family Medicine

## 2013-09-07 ENCOUNTER — Ambulatory Visit (INDEPENDENT_AMBULATORY_CARE_PROVIDER_SITE_OTHER): Payer: Medicare Other | Admitting: Family Medicine

## 2013-09-07 ENCOUNTER — Other Ambulatory Visit: Payer: Self-pay | Admitting: General Practice

## 2013-09-07 ENCOUNTER — Encounter: Payer: Self-pay | Admitting: Family Medicine

## 2013-09-07 VITALS — BP 140/78 | HR 64 | Temp 98.2°F | Resp 16 | Wt 181.2 lb

## 2013-09-07 DIAGNOSIS — E781 Pure hyperglyceridemia: Secondary | ICD-10-CM

## 2013-09-07 DIAGNOSIS — M1990 Unspecified osteoarthritis, unspecified site: Secondary | ICD-10-CM

## 2013-09-07 DIAGNOSIS — E119 Type 2 diabetes mellitus without complications: Secondary | ICD-10-CM

## 2013-09-07 MED ORDER — CELECOXIB 100 MG PO CAPS: 200.0000 mg | ORAL_CAPSULE | Freq: Two times a day (BID) | ORAL | Status: DC

## 2013-09-07 MED ORDER — OLMESARTAN MEDOXOMIL 40 MG PO TABS: 40.0000 mg | ORAL_TABLET | Freq: Every day | ORAL | Status: DC

## 2013-09-07 NOTE — Assessment & Plan Note (Signed)
Chronic problem.  Not currently on fenofibrate.  Pt reports this is too expensive.  Has been exercising and eating low fat diet.  Repeat labs and determine if meds are necessary.

## 2013-09-07 NOTE — Assessment & Plan Note (Signed)
Deteriorated per pt.  Has been following w/ [facility] and was told to increase Celebrex to 400mg  BID.  Due for labs to monitor renal function.  May need to decrease dose based on renal fxn.

## 2013-09-07 NOTE — Progress Notes (Signed)
Pre visit review using our clinic review tool, if applicable. No additional management support is needed unless otherwise documented below in the visit note. 

## 2013-09-07 NOTE — Patient Instructions (Signed)
Follow up in 3-4 months to recheck sugar We'll notify you of your lab results and make any changes if needed Keep up the good work on healthy diet and regular exercise Call with any questions or concerns Happy Early [place] OuchBirthday!!!

## 2013-09-07 NOTE — Assessment & Plan Note (Signed)
Chronic problem.  Tolerating meds w/o difficulty.  Currently asymptomatic.  Due for eye exam- encouraged pt to schedule.  Normal foot exam.  On ARB.  Check labs.  Adjust meds prn

## 2013-09-07 NOTE — Progress Notes (Signed)
   Subjective:    Patient ID: Allen Jones, male    DOB: 05/19/1941, 73 y.o.   MRN: [id number]  HPI DM- chronic problem, on Glipizide.  Not checking sugars.  Due for eye exam this year.  Denies symptomatic lows.  No CP, SOB, HAs, visual changes, edema, numbness/tingling of hands/fat.  No N/V/D.  Hypertriglyceridemia- chronic problem, not on fenofibrate due to cost.  Has been exercising regularly.  Eating low fat diet.  OA- pt has chronic pain in legs, on Celebrex but has increased to 200mg  BID.  Orthopedics Thamas Jaegers([facility]) told him to increase dose.   Review of Systems For ROS see HPI     Objective:   Physical Exam  Vitals reviewed. Constitutional: He is oriented to person, place, and time. He appears well-developed and well-nourished. No distress.  HENT:  Head: Normocephalic and atraumatic.  Eyes: Conjunctivae and EOM are normal. Pupils are equal, round, and reactive to light.  Neck: Normal range of motion. Neck supple. No thyromegaly present.  Cardiovascular: Normal rate, regular rhythm, normal heart sounds and intact distal pulses.   No murmur heard. Pulmonary/Chest: Effort normal and breath sounds normal. No respiratory distress.  Abdominal: Soft. Bowel sounds are normal. He exhibits no distension.  Musculoskeletal: He exhibits no edema.  Lymphadenopathy:    He has no cervical adenopathy.  Neurological: He is alert and oriented to person, place, and time. No cranial nerve deficit.  Skin: Skin is warm and dry.  Psychiatric: He has a normal mood and affect. His behavior is normal.          Assessment & Plan:

## 2013-09-08 LAB — HEPATIC FUNCTION PANEL
ALT: 30 U/L (ref 0–53)
AST: 40 U/L — ABNORMAL HIGH (ref 0–37)
Albumin: 4 g/dL (ref 3.5–5.2)
Alkaline Phosphatase: 72 U/L (ref 39–117)
BILIRUBIN TOTAL: 0.7 mg/dL (ref 0.3–1.2)
Bilirubin, Direct: 0.1 mg/dL (ref 0.0–0.3)
Total Protein: 7.5 g/dL (ref 6.0–8.3)

## 2013-09-08 LAB — LIPID PANEL
CHOL/HDL RATIO: 6
Cholesterol: 221 mg/dL — ABNORMAL HIGH (ref 0–200)
HDL: 34.7 mg/dL — AB (ref >39.00)
LDL Cholesterol: 130 mg/dL — ABNORMAL HIGH (ref 0–99)
TRIGLYCERIDES: 281 mg/dL — AB (ref 0.0–149.0)
VLDL: 56.2 mg/dL — ABNORMAL HIGH (ref 0.0–40.0)

## 2013-09-08 LAB — BASIC METABOLIC PANEL
BUN: 23 mg/dL (ref 6–23)
CALCIUM: 9.6 mg/dL (ref 8.4–10.5)
CHLORIDE: 101 meq/L (ref 96–112)
CO2: 26 mEq/L (ref 19–32)
CREATININE: 1.4 mg/dL (ref 0.4–1.5)
GFR: 53.39 mL/min — ABNORMAL LOW (ref >60.00)
Glucose, Bld: 68 mg/dL — ABNORMAL LOW (ref 70–99)
Potassium: 4.6 mEq/L (ref 3.5–5.1)
Sodium: 137 mEq/L (ref 135–145)

## 2013-09-08 LAB — HEMOGLOBIN A1C: Hgb A1c MFr Bld: 6.3 % (ref 4.6–6.5)

## 2013-09-09 ENCOUNTER — Other Ambulatory Visit: Payer: Self-pay

## 2013-09-09 ENCOUNTER — Other Ambulatory Visit: Payer: Self-pay | Admitting: General Practice

## 2013-09-09 ENCOUNTER — Encounter: Payer: Self-pay | Admitting: Family Medicine

## 2013-09-10 MED ORDER — ATORVASTATIN CALCIUM 20 MG PO TABS: 20.0000 mg | ORAL_TABLET | Freq: Every day | ORAL | Status: DC

## 2013-09-10 NOTE — Telephone Encounter (Signed)
Med filled.  

## 2013-09-20 ENCOUNTER — Telehealth: Payer: Self-pay

## 2013-09-20 NOTE — Telephone Encounter (Signed)
Relevant patient education assigned to patient using Emmi. ° °

## 2013-10-12 ENCOUNTER — Other Ambulatory Visit: Payer: Self-pay | Admitting: Family Medicine

## 2013-10-13 NOTE — Telephone Encounter (Signed)
Med filled.  

## 2013-10-25 ENCOUNTER — Encounter: Payer: Self-pay | Admitting: Family Medicine

## 2013-11-22 ENCOUNTER — Other Ambulatory Visit: Payer: Self-pay | Admitting: General Practice

## 2013-11-22 MED ORDER — GLIPIZIDE ER 2.5 MG PO TB24: 2.5000 mg | ORAL_TABLET | Freq: Every day | ORAL | Status: DC

## 2013-11-22 MED ORDER — OMEPRAZOLE 40 MG PO CPDR: 40.0000 mg | DELAYED_RELEASE_CAPSULE | Freq: Every day | ORAL | Status: DC

## 2013-12-15 ENCOUNTER — Encounter: Payer: Self-pay | Admitting: Family Medicine

## 2013-12-15 ENCOUNTER — Ambulatory Visit (INDEPENDENT_AMBULATORY_CARE_PROVIDER_SITE_OTHER): Payer: Medicare Other | Admitting: Family Medicine

## 2013-12-15 VITALS — BP 150/90 | HR 71 | Temp 98.6°F | Resp 16 | Wt 179.4 lb

## 2013-12-15 DIAGNOSIS — J40 Bronchitis, not specified as acute or chronic: Secondary | ICD-10-CM

## 2013-12-15 DIAGNOSIS — J301 Allergic rhinitis due to pollen: Secondary | ICD-10-CM

## 2013-12-15 MED ORDER — BENZONATATE 200 MG PO CAPS: 200.0000 mg | ORAL_CAPSULE | Freq: Three times a day (TID) | ORAL | Status: DC | PRN

## 2013-12-15 MED ORDER — PROMETHAZINE-DM 6.25-15 MG/5ML PO SYRP: 5.0000 mL | ORAL_SOLUTION | Freq: Four times a day (QID) | ORAL | Status: DC | PRN

## 2013-12-15 NOTE — Progress Notes (Signed)
   Subjective:    Patient ID: Allen Jones A Rouser, male    DOB: [birth year], 73 y.o.   MRN: [id number]  Cough   Cough- sxs started last week.  Subjective temp x3 days.  No facial pain/pressure.  No ear pain.  No N/V.  No sore throat.  No known sick contacts.  Pt reports only bothersome sx is cough and associated chest tightness.  Took mucinex and cough became productive.  Hx of seasonal allergies- not currently on antihistamine and stopped nasal steroid spray.   Review of Systems  Respiratory: Positive for cough.    For ROS see HPI     Objective:   Physical Exam  Vitals reviewed. Constitutional: He appears well-developed and well-nourished. No distress.  HENT:  Head: Normocephalic and atraumatic.  No TTP over sinuses + turbinate edema + PND TMs normal bilaterally  Eyes: Conjunctivae and EOM are normal. Pupils are equal, round, and reactive to light.  Neck: Normal range of motion. Neck supple.  Cardiovascular: Normal rate, regular rhythm and normal heart sounds.   Pulmonary/Chest: Effort normal and breath sounds normal. No respiratory distress. He has no wheezes.  Lymphadenopathy:    He has no cervical adenopathy.  Skin: Skin is warm and dry.          Assessment & Plan:

## 2013-12-15 NOTE — Patient Instructions (Signed)
Schedule your diabetes follow up in 1 month Restart the Flonase daily Add Claritin or Zyrtec daily to better control the allergy symptoms Drink plenty of fluids Use Tessalon as needed for daytime cough, syrup for night (will cause drowsiness) REST! Call with any questions or concerns Hang in there!!!

## 2013-12-15 NOTE — Progress Notes (Signed)
Pre visit review using our clinic review tool, if applicable. No additional management support is needed unless otherwise documented below in the visit note. 

## 2013-12-15 NOTE — Assessment & Plan Note (Signed)
Pt reports cough- worse at night and intermittently productive.  No cough heard during OV.  Lungs CTA.  Suspect this is combination of viral illness and untreated seasonal allergies.  No need for abx.  Cough meds prn.  Reviewed supportive care and red flags that should prompt return.  Pt expressed understanding and is in agreement w/ plan.

## 2013-12-15 NOTE — Assessment & Plan Note (Signed)
Deteriorated b/c pt stopped Flonase.  Restart nasal spray, add daily antihistamine.  Reviewed supportive care and red flags that should prompt return.  Pt expressed understanding and is in agreement w/ plan.

## 2014-01-18 ENCOUNTER — Ambulatory Visit (INDEPENDENT_AMBULATORY_CARE_PROVIDER_SITE_OTHER): Payer: Medicare Other | Admitting: Family Medicine

## 2014-01-18 ENCOUNTER — Encounter: Payer: Self-pay | Admitting: Family Medicine

## 2014-01-18 VITALS — BP 130/80 | HR 56 | Temp 97.9°F | Resp 16 | Wt 176.0 lb

## 2014-01-18 DIAGNOSIS — E119 Type 2 diabetes mellitus without complications: Secondary | ICD-10-CM

## 2014-01-18 LAB — BASIC METABOLIC PANEL
BUN: 22 mg/dL (ref 6–23)
CHLORIDE: 102 meq/L (ref 96–112)
CO2: 29 mEq/L (ref 19–32)
CREATININE: 1.5 mg/dL (ref 0.4–1.5)
Calcium: 9.9 mg/dL (ref 8.4–10.5)
GFR: 49.98 mL/min — ABNORMAL LOW (ref >60.00)
Glucose, Bld: 85 mg/dL (ref 70–99)
Potassium: 4.4 mEq/L (ref 3.5–5.1)
Sodium: 139 mEq/L (ref 135–145)

## 2014-01-18 LAB — HEMOGLOBIN A1C: HEMOGLOBIN A1C: 6.3 % (ref 4.6–6.5)

## 2014-01-18 NOTE — Progress Notes (Signed)
   Subjective:    Patient ID: Allen Jones, male    DOB: 21-Aug-1940, 73 y.o.   MRN: [id number]  HPI DM- chronic problem.  On Glipizide daily.  On ARB for renal protection.  Not checking CBGs at home.  Due for eye exam (last done 2 yrs ago).  Denies symptomatic lows.  No CP, SOB, HAs, visual changes, no abd pain, N/V, edema, numbness/tingling of hands/feet.   Review of Systems For ROS see HPI     Objective:   Physical Exam  Vitals reviewed. Constitutional: He is oriented to person, place, and time. He appears well-developed and well-nourished. No distress.  HENT:  Head: Normocephalic and atraumatic.  Eyes: Conjunctivae and EOM are normal. Pupils are equal, round, and reactive to light.  Neck: Normal range of motion. Neck supple. No thyromegaly present.  Cardiovascular: Normal rate, regular rhythm, normal heart sounds and intact distal pulses.   No murmur heard. Pulmonary/Chest: Effort normal and breath sounds normal. No respiratory distress.  Abdominal: Soft. Bowel sounds are normal. He exhibits no distension.  Musculoskeletal: He exhibits no edema.  Lymphadenopathy:    He has no cervical adenopathy.  Neurological: He is alert and oriented to person, place, and time. No cranial nerve deficit.  Skin: Skin is warm and dry.  Psychiatric: He has a normal mood and affect. His behavior is normal.          Assessment & Plan:

## 2014-01-18 NOTE — Patient Instructions (Signed)
Schedule your complete physical for after December We'll notify you of your lab results and make any changes if needed Keep up the good work!  You look great! Call with any questions or concerns Happy Labor Day!!!

## 2014-01-18 NOTE — Assessment & Plan Note (Signed)
Chronic problem.  Asymptomatic.  Due for eye exam- plans to schedule.  On ARB for renal protection.  Check labs.  Adjust meds prn

## 2014-01-18 NOTE — Progress Notes (Signed)
Pre visit review using our clinic review tool, if applicable. No additional management support is needed unless otherwise documented below in the visit note. 

## 2014-03-03 ENCOUNTER — Other Ambulatory Visit: Payer: Self-pay | Admitting: General Practice

## 2014-03-03 MED ORDER — TEMAZEPAM 15 MG PO CAPS: ORAL_CAPSULE | ORAL | Status: DC

## 2014-03-03 NOTE — Telephone Encounter (Signed)
Med filled.  

## 2014-03-08 ENCOUNTER — Other Ambulatory Visit: Payer: Self-pay | Admitting: General Practice

## 2014-03-08 MED ORDER — HYDROCHLOROTHIAZIDE 12.5 MG PO TABS: 12.5000 mg | ORAL_TABLET | Freq: Every day | ORAL | Status: DC

## 2014-04-28 LAB — HM DIABETES EYE EXAM

## 2014-04-28 LAB — HM DIABETES FOOT EXAM: HM DIABETIC FOOT EXAM: NORMAL

## 2014-05-02 ENCOUNTER — Other Ambulatory Visit: Payer: Self-pay | Admitting: Family Medicine

## 2014-05-02 NOTE — Telephone Encounter (Signed)
Med filled.  

## 2014-05-10 ENCOUNTER — Encounter: Payer: Self-pay | Admitting: General Practice

## 2014-06-25 ENCOUNTER — Encounter: Payer: Self-pay | Admitting: Family Medicine

## 2014-06-27 MED ORDER — TEMAZEPAM 15 MG PO CAPS: ORAL_CAPSULE | ORAL | Status: DC

## 2014-08-18 ENCOUNTER — Telehealth: Payer: Self-pay | Admitting: Family Medicine

## 2014-08-18 NOTE — Telephone Encounter (Signed)
Pre visit letter sent  °

## 2014-08-22 ENCOUNTER — Other Ambulatory Visit: Payer: Self-pay | Admitting: Family Medicine

## 2014-08-23 NOTE — Telephone Encounter (Signed)
Med filled.  

## 2014-09-06 ENCOUNTER — Encounter: Payer: Self-pay | Admitting: *Deleted

## 2014-09-06 ENCOUNTER — Telehealth: Payer: Self-pay | Admitting: *Deleted

## 2014-09-06 NOTE — Telephone Encounter (Signed)
Pre-Visit Call completed with patient and chart updated.   Pre-Visit Info documented in Specialty Comments under [employer].    

## 2014-09-07 ENCOUNTER — Encounter: Payer: Self-pay | Admitting: General Practice

## 2014-09-07 ENCOUNTER — Encounter: Payer: Self-pay | Admitting: Family Medicine

## 2014-09-07 ENCOUNTER — Ambulatory Visit (INDEPENDENT_AMBULATORY_CARE_PROVIDER_SITE_OTHER): Payer: Medicare Other | Admitting: Family Medicine

## 2014-09-07 VITALS — BP 130/78 | HR 60 | Temp 97.9°F | Resp 16 | Ht 68.0 in | Wt 180.5 lb

## 2014-09-07 DIAGNOSIS — E781 Pure hyperglyceridemia: Secondary | ICD-10-CM

## 2014-09-07 DIAGNOSIS — I10 Essential (primary) hypertension: Secondary | ICD-10-CM

## 2014-09-07 DIAGNOSIS — Z0000 Encounter for general adult medical examination without abnormal findings: Secondary | ICD-10-CM

## 2014-09-07 DIAGNOSIS — E119 Type 2 diabetes mellitus without complications: Secondary | ICD-10-CM | POA: Diagnosis not present

## 2014-09-07 LAB — CBC WITH DIFFERENTIAL/PLATELET
Basophils Absolute: 0 10*3/uL (ref 0.0–0.1)
Basophils Relative: 0.5 % (ref 0.0–3.0)
Eosinophils Absolute: 0.2 10*3/uL (ref 0.0–0.7)
Eosinophils Relative: 2.7 % (ref 0.0–5.0)
HCT: 47 % (ref 39.0–52.0)
HEMOGLOBIN: 16.2 g/dL (ref 13.0–17.0)
Lymphocytes Relative: 25.1 % (ref 12.0–46.0)
Lymphs Abs: 1.9 10*3/uL (ref 0.7–4.0)
MCHC: 34.4 g/dL (ref 30.0–36.0)
MCV: 92.6 fl (ref 78.0–100.0)
MONOS PCT: 7 % (ref 3.0–12.0)
Monocytes Absolute: 0.5 10*3/uL (ref 0.1–1.0)
NEUTROS ABS: 4.8 10*3/uL (ref 1.4–7.7)
NEUTROS PCT: 64.7 % (ref 43.0–77.0)
Platelets: 253 10*3/uL (ref 150.0–400.0)
RBC: 5.08 Mil/uL (ref 4.22–5.81)
RDW: 13.7 % (ref 11.5–15.5)
WBC: 7.4 10*3/uL (ref 4.0–10.5)

## 2014-09-07 LAB — HEMOGLOBIN A1C: Hgb A1c MFr Bld: 6.6 % — ABNORMAL HIGH (ref 4.6–6.5)

## 2014-09-07 MED ORDER — CELECOXIB 100 MG PO CAPS: 200.0000 mg | ORAL_CAPSULE | Freq: Two times a day (BID) | ORAL | Status: DC

## 2014-09-07 MED ORDER — OLMESARTAN MEDOXOMIL 40 MG PO TABS: 40.0000 mg | ORAL_TABLET | Freq: Every day | ORAL | Status: DC

## 2014-09-07 NOTE — Progress Notes (Signed)
Pre visit review using our clinic review tool, if applicable. No additional management support is needed unless otherwise documented below in the visit note. 

## 2014-09-07 NOTE — Patient Instructions (Signed)
Follow up in 3-4 months to recheck diabetes We'll notify you of your lab results and make any changes if needed Keep up the good work on healthy diet and regular exercise- looking good! Take Zyrtec in addition to your daily nasal spray to help w/ allergies Call with any questions or concerns Happy Early Birthday!!

## 2014-09-07 NOTE — Progress Notes (Signed)
   Subjective:    Patient ID: Allen Jones, male    DOB: 1940/12/06, [age] y.o.   MRN: [id number]  HPI Here today for CPE.  Risk Factors: DM- chronic problem, on Glipizide XL 2.5mg  daily.  On ARB for renal protection.  UTD on eye exam and foot exam. HTN- chronic problem, on HCTZ, Benicar, Hyperlipidemia- chronic problem, on Lipitor daily Physical Activity: exercising regularly at the gym Fall Risk: low risk Depression: denies current sxs Hearing: normal to conversational tones, decreased to whispered voice ADL's: independent Cognitive: pt w/ clear cognitive decline since last visit Home Safety: safe at home, lives w/ wife Height, Weight, BMI, Visual Acuity: see vitals, vision corrected to 20/20 w/ glasses Counseling: UTD on colonoscopy, foot exam/eye exam.  Pt declines Prevnar Labs Ordered: See A&P Care Plan: See A&P    Review of Systems Patient reports no vision/hearing changes, anorexia, fever ,adenopathy, persistant/recurrent hoarseness, swallowing issues, chest pain, palpitations, edema, persistant/recurrent cough, hemoptysis, dyspnea (rest,exertional, paroxysmal nocturnal), gastrointestinal  bleeding (melena, rectal bleeding), abdominal pain, excessive heart burn, GU symptoms (dysuria, hematuria, voiding/incontinence issues) syncope, focal weakness, numbness & tingling, skin/hair/nail changes, depression, anxiety, abnormal bruising/bleeding, musculoskeletal symptoms/signs.   + memory loss    Objective:   Physical Exam General Appearance:    Alert, cooperative, no distress, appears stated age  Head:    Normocephalic, without obvious abnormality, atraumatic  Eyes:    PERRL, conjunctiva/corneas clear, EOM's intact, fundi    benign, both eyes       Ears:    Normal TM's and external ear canals, both ears  Nose:   Nares normal, septum midline, mucosa normal, no drainage   or sinus tenderness  Throat:   Lips, mucosa, and tongue normal; teeth and gums normal  Neck:   Supple,  symmetrical, trachea midline, no adenopathy;       thyroid:  No enlargement/tenderness/nodules  Back:     Symmetric, no curvature, ROM normal, no CVA tenderness  Lungs:     Clear to auscultation bilaterally, respirations unlabored  Chest wall:    No tenderness or deformity  Heart:    Regular rate and rhythm, S1 and S2 normal, no murmur, rub   or gallop  Abdomen:     Soft, non-tender, bowel sounds active all four quadrants,    no masses, no organomegaly  Genitalia:    Deferred at pt's request  Rectal:    Deferred at pt's request  Extremities:   Extremities normal, atraumatic, no cyanosis or edema  Pulses:   2+ and symmetric all extremities  Skin:   Skin color, texture, turgor normal, no rashes or lesions  Lymph nodes:   Cervical, supraclavicular, and axillary nodes normal  Neurologic:   CNII-XII intact. Normal strength, sensation and reflexes      throughout          Assessment & Plan:

## 2014-09-08 LAB — BASIC METABOLIC PANEL
BUN: 21 mg/dL (ref 6–23)
CALCIUM: 10 mg/dL (ref 8.4–10.5)
CO2: 28 meq/L (ref 19–32)
CREATININE: 1.41 mg/dL (ref 0.40–1.50)
Chloride: 101 mEq/L (ref 96–112)
GFR: 51.94 mL/min — ABNORMAL LOW (ref >60.00)
GLUCOSE: 109 mg/dL — AB (ref 70–99)
Potassium: 5 mEq/L (ref 3.5–5.1)
Sodium: 136 mEq/L (ref 135–145)

## 2014-09-08 LAB — HEPATIC FUNCTION PANEL
ALT: 18 U/L (ref 0–53)
AST: 18 U/L (ref 0–37)
Albumin: 4 g/dL (ref 3.5–5.2)
Alkaline Phosphatase: 103 U/L (ref 39–117)
BILIRUBIN TOTAL: 0.6 mg/dL (ref 0.2–1.2)
Bilirubin, Direct: 0.1 mg/dL (ref 0.0–0.3)
TOTAL PROTEIN: 7.2 g/dL (ref 6.0–8.3)

## 2014-09-08 LAB — LDL CHOLESTEROL, DIRECT: Direct LDL: 125 mg/dL

## 2014-09-08 LAB — LIPID PANEL
CHOL/HDL RATIO: 6
CHOLESTEROL: 211 mg/dL — AB (ref 0–200)
HDL: 35.1 mg/dL — ABNORMAL LOW (ref >39.00)
NonHDL: 175.9
TRIGLYCERIDES: 263 mg/dL — AB (ref 0.0–149.0)
VLDL: 52.6 mg/dL — AB (ref 0.0–40.0)

## 2014-09-08 LAB — TSH: TSH: 2.76 u[IU]/mL (ref 0.35–4.50)

## 2014-09-08 LAB — PSA: PSA: 1.08 ng/mL (ref 0.10–4.00)

## 2014-09-11 ENCOUNTER — Encounter: Payer: Self-pay | Admitting: Family Medicine

## 2014-09-11 NOTE — Assessment & Plan Note (Signed)
Chronic problem.  Pt stopped statin.  Check labs.  Restart prn.

## 2014-09-11 NOTE — Assessment & Plan Note (Signed)
Chronic problem, well controlled.  Asymptomatic.  Check labs.  No anticipated med changes. °

## 2014-09-11 NOTE — Assessment & Plan Note (Signed)
Pt's PE WNL w/ exception of notable cognitive decline.  UTD on colonoscopy.  Written screening schedule updated and given to pt.  Pt declines Prevnar today.  Check labs.  Anticipatory guidance provided.

## 2014-09-11 NOTE — Assessment & Plan Note (Signed)
Chronic problem.  On ARB for renal protection.  UTD on eye and foot exam.  Check labs.  Adjust meds prn.

## 2014-09-12 MED ORDER — FLUTICASONE PROPIONATE 50 MCG/ACT NA SUSP: 1.0000 | Freq: Two times a day (BID) | NASAL | Status: DC

## 2014-09-12 NOTE — Telephone Encounter (Signed)
Med filled.  

## 2014-09-12 NOTE — Telephone Encounter (Signed)
This medication was filled today.   Jessica.

## 2014-10-13 ENCOUNTER — Other Ambulatory Visit: Payer: Self-pay | Admitting: Family Medicine

## 2014-10-13 NOTE — Telephone Encounter (Signed)
Med filled.  

## 2014-11-07 ENCOUNTER — Other Ambulatory Visit: Payer: Self-pay | Admitting: General Practice

## 2014-11-07 MED ORDER — TEMAZEPAM 15 MG PO CAPS: ORAL_CAPSULE | ORAL | Status: DC

## 2014-11-28 ENCOUNTER — Other Ambulatory Visit: Payer: Self-pay

## 2015-01-09 ENCOUNTER — Other Ambulatory Visit: Payer: Self-pay | Admitting: Family Medicine

## 2015-01-09 NOTE — Telephone Encounter (Signed)
Medication filled to pharmacy as requested.   

## 2015-01-12 ENCOUNTER — Ambulatory Visit: Payer: Medicare Other | Admitting: Family Medicine

## 2015-01-13 ENCOUNTER — Encounter: Payer: Self-pay | Admitting: Family Medicine

## 2015-01-13 ENCOUNTER — Ambulatory Visit (INDEPENDENT_AMBULATORY_CARE_PROVIDER_SITE_OTHER): Payer: Medicare Other | Admitting: Family Medicine

## 2015-01-13 VITALS — BP 122/82 | HR 77 | Temp 97.9°F | Resp 16 | Ht 68.0 in | Wt 181.5 lb

## 2015-01-13 DIAGNOSIS — E119 Type 2 diabetes mellitus without complications: Secondary | ICD-10-CM | POA: Diagnosis not present

## 2015-01-13 DIAGNOSIS — Z23 Encounter for immunization: Secondary | ICD-10-CM

## 2015-01-13 NOTE — Progress Notes (Signed)
Pre visit review using our clinic review tool, if applicable. No additional management support is needed unless otherwise documented below in the visit note. 

## 2015-01-13 NOTE — Assessment & Plan Note (Signed)
Chronic problem for pt.  Tolerating glipizide w/o difficulty.  UTD on eye and foot exam.  On ARB for renal protection.  Pt refuses statin.  Exercising regularly but less than before.  Check labs.  Adjust meds prn

## 2015-01-13 NOTE — Progress Notes (Signed)
   Subjective:    Patient ID: Allen Jones, male    DOB: 1941/04/27, 74 y.o.   MRN: [id number]  HPI DM- chronic problem.  On Glipizide.  UTD on eye and foot exam.  On ARB for renal protection.  Not on statin- pt refuses.  Denies symptomatic lows.  No CP, SOB, HAs, visual changes, abd pain, N/V.  Some exercise.   Review of Systems For ROS see HPI     Objective:   Physical Exam  Constitutional: He is oriented to person, place, and time. He appears well-developed and well-nourished. No distress.  HENT:  Head: Normocephalic and atraumatic.  Eyes: Conjunctivae and EOM are normal. Pupils are equal, round, and reactive to light.  Neck: Normal range of motion. Neck supple. No thyromegaly present.  Cardiovascular: Normal rate, regular rhythm, normal heart sounds and intact distal pulses.   No murmur heard. Pulmonary/Chest: Effort normal and breath sounds normal. No respiratory distress.  Abdominal: Soft. Bowel sounds are normal. He exhibits no distension.  Musculoskeletal: He exhibits no edema.  Lymphadenopathy:    He has no cervical adenopathy.  Neurological: He is alert and oriented to person, place, and time. No cranial nerve deficit.  Skin: Skin is warm and dry.  Psychiatric: He has a normal mood and affect. His behavior is normal.  Vitals reviewed.         Assessment & Plan:

## 2015-01-13 NOTE — Patient Instructions (Signed)
Follow up in 3-4 months to recheck diabetes and cholesterol We'll notify you of your lab results and make any changes if needed Keep up the good work!  You look great! Call with any questions or concerns Enjoy the rest of your summer!!!

## 2015-01-14 LAB — HEMOGLOBIN A1C
Hgb A1c MFr Bld: 6.5 % — ABNORMAL HIGH (ref <5.7)
Mean Plasma Glucose: 140 mg/dL — ABNORMAL HIGH (ref <117)

## 2015-02-28 ENCOUNTER — Telehealth: Payer: Self-pay | Admitting: Family Medicine

## 2015-02-28 NOTE — Telephone Encounter (Signed)
Caller name: Fabiola Backer [facility] [employer])   Relationship to patient: Insurance carrier  Can be reached: [phone]  FAX: [phone]    Reason for call: She is following up on a PA form that was faxed for a medication request that was sent. . They need the form back to them. Please fax back to the number provided.    Thanks.

## 2015-03-01 NOTE — Telephone Encounter (Signed)
Form completed and faxed back to [facility]. Awaiting determination. JG//CMA

## 2015-03-08 NOTE — Telephone Encounter (Signed)
QLE request for omeprazole  denied. Insurance will only pay for one capsule per day. Please advise.

## 2015-03-08 NOTE — Telephone Encounter (Signed)
Prescription is written for 1 capsule daily- I'm not sure what the issue is

## 2015-03-13 ENCOUNTER — Telehealth: Payer: Self-pay | Admitting: *Deleted

## 2015-03-13 MED ORDER — TEMAZEPAM 15 MG PO CAPS: ORAL_CAPSULE | ORAL | Status: DC

## 2015-03-13 NOTE — Telephone Encounter (Signed)
Medication Detail      Disp Refills Start End     temazepam (RESTORIL) 15 MG capsule 60 capsule 3 11/07/2014     Sig: TAKE TWO CAPSULES BY MOUTH AT BEDTIME AS NEEDED FOR SLEEP    Class: Print   Pharmacy    [id number] - [place], [place] - [address]   LOV: 04.10.16 ROV: 3-4 Mths; Cancel 08.11.16 appt, no future appt scheduled Please Advise on refills/SLS

## 2015-03-13 NOTE — Telephone Encounter (Signed)
Ok for #60, 3 refills.  Please remind pt to schedule appt

## 2015-03-13 NOTE — Telephone Encounter (Signed)
Rx request phoned to pharmacy/SLS Requested drug refills are authorized, however, the patient needs further evaluation and/or laboratory testing before further refills are given. Ask him to make an appointment for this.

## 2015-04-14 ENCOUNTER — Ambulatory Visit (INDEPENDENT_AMBULATORY_CARE_PROVIDER_SITE_OTHER): Payer: Medicare Other | Admitting: Family Medicine

## 2015-04-14 ENCOUNTER — Other Ambulatory Visit: Payer: Self-pay | Admitting: Hematology & Oncology

## 2015-04-14 ENCOUNTER — Other Ambulatory Visit: Payer: Self-pay | Admitting: Family Medicine

## 2015-04-14 ENCOUNTER — Ambulatory Visit (HOSPITAL_BASED_OUTPATIENT_CLINIC_OR_DEPARTMENT_OTHER)
Admission: RE | Admit: 2015-04-14 | Discharge: 2015-04-14 | Disposition: A | Discharge status: Home / Self Care | Payer: Medicare Other | Source: Ambulatory Visit | Attending: Family Medicine | Admitting: Family Medicine

## 2015-04-14 ENCOUNTER — Encounter: Payer: Self-pay | Admitting: Family Medicine

## 2015-04-14 ENCOUNTER — Telehealth: Payer: Self-pay | Admitting: *Deleted

## 2015-04-14 VITALS — BP 124/86 | HR 77 | Temp 98.1°F | Resp 16 | Ht 68.0 in | Wt 180.2 lb

## 2015-04-14 DIAGNOSIS — M7989 Other specified soft tissue disorders: Secondary | ICD-10-CM | POA: Insufficient documentation

## 2015-04-14 DIAGNOSIS — I82411 Acute embolism and thrombosis of right femoral vein: Secondary | ICD-10-CM

## 2015-04-14 DIAGNOSIS — R7989 Other specified abnormal findings of blood chemistry: Secondary | ICD-10-CM

## 2015-04-14 DIAGNOSIS — I82401 Acute embolism and thrombosis of unspecified deep veins of right lower extremity: Secondary | ICD-10-CM | POA: Diagnosis not present

## 2015-04-14 LAB — CBC WITH DIFFERENTIAL/PLATELET
Basophils Absolute: 0 10*3/uL (ref 0.0–0.1)
Basophils Relative: 0 % (ref 0.0–3.0)
EOS PCT: 1.4 % (ref 0.0–5.0)
Eosinophils Absolute: 0.3 10*3/uL (ref 0.0–0.7)
HEMATOCRIT: 45.2 % (ref 39.0–52.0)
HEMOGLOBIN: 15.3 g/dL (ref 13.0–17.0)
LYMPHS ABS: 2 10*3/uL (ref 0.7–4.0)
Lymphocytes Relative: 10.3 % — ABNORMAL LOW (ref 12.0–46.0)
MCHC: 33.9 g/dL (ref 30.0–36.0)
MCV: 93.6 fl (ref 78.0–100.0)
MONO ABS: 0.8 10*3/uL (ref 0.1–1.0)
Monocytes Relative: 3.9 % (ref 3.0–12.0)
NEUTROS ABS: 16.6 10*3/uL — AB (ref 1.4–7.7)
Neutrophils Relative %: 84.4 % — ABNORMAL HIGH (ref 43.0–77.0)
Platelets: 170 10*3/uL (ref 150.0–400.0)
RBC: 4.82 Mil/uL (ref 4.22–5.81)
RDW: 14.1 % (ref 11.5–15.5)
WBC: 19.7 10*3/uL (ref 4.0–10.5)

## 2015-04-14 LAB — BASIC METABOLIC PANEL
BUN: 31 mg/dL — AB (ref 6–23)
CO2: 27 mEq/L (ref 19–32)
Calcium: 9.8 mg/dL (ref 8.4–10.5)
Chloride: 101 mEq/L (ref 96–112)
Creatinine, Ser: 1.82 mg/dL — ABNORMAL HIGH (ref 0.40–1.50)
GFR: 38.63 mL/min — ABNORMAL LOW (ref >60.00)
Glucose, Bld: 151 mg/dL — ABNORMAL HIGH (ref 70–99)
Potassium: 5.1 mEq/L (ref 3.5–5.1)
Sodium: 133 mEq/L — ABNORMAL LOW (ref 135–145)

## 2015-04-14 LAB — HEPATIC FUNCTION PANEL
ALT: 23 U/L (ref 0–53)
AST: 19 U/L (ref 0–37)
Albumin: 4 g/dL (ref 3.5–5.2)
Alkaline Phosphatase: 94 U/L (ref 39–117)
Bilirubin, Direct: 0.1 mg/dL (ref 0.0–0.3)
Total Bilirubin: 0.7 mg/dL (ref 0.2–1.2)
Total Protein: 7.2 g/dL (ref 6.0–8.3)

## 2015-04-14 LAB — TSH: TSH: 2.6 u[IU]/mL (ref 0.35–4.50)

## 2015-04-14 MED ORDER — RIVAROXABAN 15 MG PO TABS: 15.0000 mg | ORAL_TABLET | Freq: Two times a day (BID) | ORAL | Status: DC

## 2015-04-14 NOTE — Assessment & Plan Note (Signed)
New.  Pt has had R knee surgery and has hx of intermittent swelling of this leg but pt and wife report that it has not been this swollen previously.  Denies pain in calf or thigh, no redness- but pt does spend quite a bit of time in bed due to his worsening dementia.  Get US to r/o DVT.  Check labs to r/o anemia, electrolyte disturbance, thyroid abnormality.  If all labs are normal and no evidence of DVT, will send in low dose lasix for him to use PRN.  Reviewed need for increased water and low salt diet.  Will continue to follow.

## 2015-04-14 NOTE — Progress Notes (Signed)
   Subjective:    Patient ID: Allen Jones, male    DOB: 1941/05/28, 74 y.o.   MRN: [id number]  HPI R leg swelling- pt reports he gained 8 lbs yesterday due to swelling.  Leg was swollen from thigh down.  Pt had R knee surgery previously.  Leg is not as swollen today.  No calf or thigh redness or tenderness.  No change in salt intake.  No CP, SOB.  No swelling on L.     Review of Systems For ROS see HPI     Objective:   Physical Exam  Constitutional: He is oriented to person, place, and time. He appears well-developed and well-nourished. No distress.  HENT:  Head: Normocephalic and atraumatic.  Cardiovascular: Normal rate, regular rhythm, normal heart sounds and intact distal pulses.   Pulmonary/Chest: Effort normal and breath sounds normal. No respiratory distress. He has no wheezes. He has no rales.  Musculoskeletal: He exhibits edema (1-2+ pitting edema of R leg to knee). He exhibits no tenderness.  Neurological: He is alert and oriented to person, place, and time.  Skin: Skin is warm and dry. No erythema.  Vitals reviewed.         Assessment & Plan:

## 2015-04-14 NOTE — Telephone Encounter (Signed)
[facility] called to report critical --patients white count @ 19.7

## 2015-04-14 NOTE — Progress Notes (Signed)
Pre visit review using our clinic review tool, if applicable. No additional management support is needed unless otherwise documented below in the visit note. 

## 2015-04-14 NOTE — Addendum Note (Signed)
Addended by: Sheliah HatchABORI, Solomiya Pascale E on: 04/14/2015 12:54 PM   Modules accepted: Orders

## 2015-04-14 NOTE — Patient Instructions (Signed)
We'll notify you of your lab and ultrasound results Increase your water intake and try to limit your salt Elevate your leg when you are sitting If the labs and ultrasound look ok, we'll send in a medication for you to use as needed for leg swelling Call with any questions or concerns Hang in there!!!

## 2015-04-17 ENCOUNTER — Encounter: Payer: Self-pay | Admitting: Family Medicine

## 2015-04-17 ENCOUNTER — Other Ambulatory Visit: Payer: Medicare Other

## 2015-04-17 ENCOUNTER — Ambulatory Visit: Payer: Medicare Other

## 2015-04-17 ENCOUNTER — Ambulatory Visit (HOSPITAL_BASED_OUTPATIENT_CLINIC_OR_DEPARTMENT_OTHER): Payer: Medicare Other | Admitting: Hematology & Oncology

## 2015-04-17 ENCOUNTER — Ambulatory Visit (HOSPITAL_BASED_OUTPATIENT_CLINIC_OR_DEPARTMENT_OTHER): Payer: Medicare Other

## 2015-04-17 ENCOUNTER — Encounter: Payer: Self-pay | Admitting: Hematology & Oncology

## 2015-04-17 VITALS — BP 139/86 | HR 81 | Temp 98.0°F | Resp 16 | Ht 68.0 in | Wt 179.0 lb

## 2015-04-17 DIAGNOSIS — I82401 Acute embolism and thrombosis of unspecified deep veins of right lower extremity: Secondary | ICD-10-CM | POA: Diagnosis not present

## 2015-04-17 DIAGNOSIS — Z7901 Long term (current) use of anticoagulants: Secondary | ICD-10-CM

## 2015-04-17 DIAGNOSIS — R7989 Other specified abnormal findings of blood chemistry: Secondary | ICD-10-CM

## 2015-04-17 LAB — CBC WITH DIFFERENTIAL (CANCER CENTER ONLY)
BASO#: 0 10*3/uL (ref 0.0–0.2)
BASO%: 0.4 % (ref 0.0–2.0)
EOS ABS: 0.2 10*3/uL (ref 0.0–0.5)
EOS%: 2 % (ref 0.0–7.0)
HCT: 44.6 % (ref 38.7–49.9)
HEMOGLOBIN: 15.3 g/dL (ref 13.0–17.1)
LYMPH#: 1.6 10*3/uL (ref 0.9–3.3)
LYMPH%: 15.2 % (ref 14.0–48.0)
MCH: 31.9 pg (ref 28.0–33.4)
MCHC: 34.3 g/dL (ref 32.0–35.9)
MCV: 93 fL (ref 82–98)
MONO#: 0.6 10*3/uL (ref 0.1–0.9)
MONO%: 5.8 % (ref 0.0–13.0)
NEUT%: 76.6 % (ref 40.0–80.0)
NEUTROS ABS: 8 10*3/uL — AB (ref 1.5–6.5)
Platelets: 198 10*3/uL (ref 145–400)
RBC: 4.8 10*6/uL (ref 4.20–5.70)
RDW: 14.1 % (ref 11.1–15.7)
WBC: 10.4 10*3/uL — ABNORMAL HIGH (ref 4.0–10.0)

## 2015-04-17 LAB — CMP (CANCER CENTER ONLY)
ALBUMIN: 3.6 g/dL (ref 3.3–5.5)
ALK PHOS: 91 U/L — AB (ref 26–84)
ALT: 21 U/L (ref 10–47)
AST: 21 U/L (ref 11–38)
BUN: 23 mg/dL — AB (ref 7–22)
CALCIUM: 9.7 mg/dL (ref 8.0–10.3)
CHLORIDE: 106 meq/L (ref 98–108)
CO2: 22 mEq/L (ref 18–33)
Creat: 1.8 mg/dl — ABNORMAL HIGH (ref 0.6–1.2)
Glucose, Bld: 157 mg/dL — ABNORMAL HIGH (ref 73–118)
POTASSIUM: 4.9 meq/L — AB (ref 3.3–4.7)
Sodium: 138 mEq/L (ref 128–145)
TOTAL PROTEIN: 8 g/dL (ref 6.4–8.1)
Total Bilirubin: 0.8 mg/dl (ref 0.20–1.60)

## 2015-04-17 LAB — CHCC SATELLITE - SMEAR

## 2015-04-17 MED ORDER — TRAMADOL HCL 50 MG PO TABS: 50.0000 mg | ORAL_TABLET | Freq: Three times a day (TID) | ORAL | Status: DC | PRN

## 2015-04-17 NOTE — Telephone Encounter (Signed)
Medication filled to pharmacy as requested.   

## 2015-04-17 NOTE — Progress Notes (Signed)
Referral MD  Reason for Referral: Right lower extremity thrombus and transient leukocytosis   Chief Complaint  Patient presents with  . OTHER    New Patient  : I have a blood clot in my right leg.  HPI: Allen Jones is a very nice 74 year old white male. He comes in with his wife. 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  He used to work for the [employer] center in [place]. It was fun talking to him about this.  Apparently, he noted that his right leg began to little swelling. He says that his right knee has been swollen. He had right knee surgery about 5 years ago.  the point where there is some discomfort. He went to see Dr. Beverely Low. She went and got a Doppler on his right leg. Not surprisingly, this showed extensive thrombus from the common femoral vein down to the calf veins.  Dr. Beverely Low do some lab work. She found that he had some leukocytosis with a white cell count of 19,700. The rest of his CBC was normal.  She went ahead and start him on Xarelto 15 mg by mouth twice a day.  She called wondering if we could see him to try to help out with the leukocytosis and to see if there is any correlation with that and the thrombus.  He has not had any foreign travel lately. He is on smoke. He does not have diabetes. He has not been immobile. He has tried exercise.  He has not noted any weight loss or weight gain.  He's had no change in bowel or bladder habits.  He's had no cough. His been no chest wall pain.  He's had no change in medications. He does not take testosterone supplements.  Overall, his performance status is ECOG 1.           uPast Medical History  Diagnosis Date  . Allergy   . Arthritis   . Hypertension   . Hyperlipidemia   . Diabetes mellitus without complication (HCC)   :  Past Surgical History  Procedure Laterality Date  . Nasal septum surgery  1970  . Lumbar disc surgery  1987  . Knee arthroscopy  1998  .  Bunionectomy  1999    Right foot  . Bladder stone removal  2000    prostate  . Hernia repair  2003    left inquinal  . Right knee replacement  2011    (possible antibiotic treatment prior to a dental or medical procedure)  . Prostate surgery  2002  :   Current outpatient prescriptions:  .  calcium-vitamin D (OSCAL WITH D) 500-200 MG-UNIT per tablet, Take 1 tablet by mouth 2 (two) times daily.  , Disp: , Rfl:  .  fluticasone (FLONASE) 50 MCG/ACT nasal spray, Place 1 spray into both nostrils 2 (two) times daily., Disp: 48 g, Rfl: 1 .  GLIPIZIDE XL 2.5 MG 24 hr tablet, TAKE 1 BY MOUTH DAILY WITH BREAKFAST, Disp: 90 tablet, Rfl: 0 .  hydrochlorothiazide (HYDRODIURIL) 12.5 MG tablet, TAKE 1 BY MOUTH DAILY, Disp: 90 tablet, Rfl: 1 .  hydroxypropyl methylcellulose (ISOPTO TEARS) 2.5 % ophthalmic solution, 1 drop.  , Disp: , Rfl:  .  olmesartan (BENICAR) 40 MG tablet, Take 1 tablet (40 mg total) by mouth daily., Disp: 90 tablet, Rfl: 4 .  omeprazole (PRILOSEC) 40 MG capsule, TAKE 1 BY MOUTH DAILY (Patient taking differently: TAKE 1 BY MOUTH twice daily), Disp: 90 capsule,  Rfl: 1 .  ranitidine (ZANTAC) 150 MG tablet, TAKE 1 BY MOUTH TWICE DAILY (Patient taking differently: TAKE 1 BY MOUTH DAILY), Disp: 180 tablet, Rfl: 0 .  Rivaroxaban (XARELTO) 15 MG TABS tablet, Take 1 tablet (15 mg total) by mouth 2 (two) times daily with a meal., Disp: 42 tablet, Rfl: 0 .  temazepam (RESTORIL) 15 MG capsule, TAKE TWO CAPSULES BY MOUTH AT BEDTIME AS NEEDED FOR SLEEP, Disp: 60 capsule, Rfl: 3 .  traMADol (ULTRAM) 50 MG tablet, Take 1 tablet (50 mg total) by mouth every 8 (eight) hours as needed., Disp: 60 tablet, Rfl: 0 .  celecoxib (CELEBREX) 100 MG capsule, Take 2 capsules (200 mg total) by mouth 2 (two) times daily. (Patient not taking: Reported on 04/17/2015), Disp: 360 capsule, Rfl: 4 .  ketoconazole (NIZORAL) 2 % cream, Apply topically daily. (Patient not taking: Reported on 04/17/2015), Disp: 180 g, Rfl:  1:  :  Allergies  Allergen Reactions  . Benadryl [Diphenhydramine Hcl]     hyper  . Fish Oil     Due to total knee   . Percocet [Oxycodone-Acetaminophen]     Hallucinations  . Tetrahydrazoline Hcl     Pink eye  . Vicodin [Hydrocodone-Acetaminophen]     High BP  :  Family History  Problem Relation Age of Onset  . Lung cancer Sister     smoker  :  Social History   Social History  . Marital Status: Married    Spouse Name: N/A  . Number of Children: N/A  . Years of Education: N/A   Occupational History  . Not on file.   Social History Main Topics  . Smoking status: Never Smoker   . Smokeless tobacco: Not on file  . Alcohol Use: No  . Drug Use: No  . Sexual Activity: Not on file   Other Topics Concern  . Not on file   Social History Narrative  :  Pertinent items are noted in HPI.  Exam: @ Well developed and well-nourished white male. His vital signs show a temperature of 98. Pulse 81. Blood pressure 139/86. Weight is 179 pounds. Head and neck exam shows no ocular or oral lesions. He has no palpable cervical or supraclavicular lymph nodes. Lungs are clear. Cardiac exam regular rate and rhythm with no murmurs, rubs or bruits. Abdomen is soft. Has good bowel sounds. There is no fluid wave. There is no palpable liver or spleen tip. Back exam shows no tenderness over the spine, ribs or hips. Extremities shows some swelling of the right lower leg. There is some mild pitting edema of the right lower leg. He has a positive Homans sign in the right leg. No obvious venous cords palpable in the right leg. Left leg is unremarkable. Skin exam shows no rashes, ecchymoses or petechia. Neurological exam shows no focal neurological deficits.    Recent Labs  04/17/15 1448  WBC 10.4*  HGB 15.3  HCT 44.6  PLT 198    Recent Labs  04/17/15 1449  NA 138  K 4.9*  CL 106  CO2 22  GLUCOSE 157*  BUN 23*  CREATININE 1.8*  CALCIUM 9.7    Blood smear review:   Normochromic and normocytic population of red blood cells. There are no nucleated red blood cells. There are no teardrop cells. I see no schistocytes or spherocytes. White cells are normal in morphology. Has good maturation of his myeloid cells. There is no immature myeloid cells. He has no hypersegmented polys. Has no  atypical lymphocytes. Platelets are adequate number and size.   Pathology: None     Assessment and Plan:  Allen Jones is a 74 year old white male. He has a thrombus in the right lower leg. 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 I did go ahead and send off some hypercoagulable studies on him.  This is a fairly extensive thrombus. I think that we will probably need to get him on anticoagulation for 6 months. He currently has started Xarelto. He is on the 50 mg twice a day dose. After 3 weeks, he will need to go on to the 20 mg a day dose.  I have a hard time explaining the transient leukocytosis. He certainly does not have any, septic thrombus. I cannot find anything that was just a myeloproliferative process, such as polycythemia that would increase the risk for thromboembolic disease. I suppose that the transient leukocytosis could've been a "acute phase reactant" from the thrombus. It is reassuring that his blood smear looks relatively normal.  I probably would repeat a Doppler of his right leg and about 2-3 months.  They will let me know where to send the 20 mg a day dose of Xarelto.  I'll like see him back in about one month.  I spent about 4245 Ms. with he and his wife. I answered all their questions. We had excellent fellowship.

## 2015-04-17 NOTE — Addendum Note (Signed)
Addended by: Yvone NeuBRODMERKEL, Sonia Stickels L on: 04/17/2015 11:03 AM   Modules accepted: Orders

## 2015-04-24 LAB — HYPERCOAGULABLE PANEL, COMPREHENSIVE
Anticardiolipin IgA: <11 [APL'U]
Anticardiolipin IgM: <12 [MPL'U]
BETA-2-GLYCOPROTEIN I IGM: 9 SMU (ref <=20)
PROTEIN S ANTIGEN, TOTAL: 113 % (ref 70–140)
Protein C Antigen: 107 % (ref 70–140)

## 2015-04-24 LAB — RFLX HEXAGONAL PHASE CONFIRM: Hexagonal Phase Confirm: POSITIVE — AB

## 2015-04-24 LAB — RFX DRVVT 1:1 MIX

## 2015-04-24 LAB — RFX PTT-LA W/RFX TO HEX PHASE CONF: PTT-LA SCREEN: 70 s — AB (ref <=40)

## 2015-04-24 LAB — RFX DRVVT SCR W/RFLX CONF 1:1 MIX
DRVVT SCREEN: 114 s — AB (ref <=45)
dRVVT Mix Interp.: POSITIVE — AB

## 2015-04-24 LAB — D-DIMER, QUANTITATIVE (NOT AT ARMC): D DIMER QUANT: 1.91 ug{FEU}/mL — AB (ref 0.00–0.48)

## 2015-04-25 ENCOUNTER — Telehealth: Payer: Self-pay | Admitting: *Deleted

## 2015-04-25 DIAGNOSIS — I825Y9 Chronic embolism and thrombosis of unspecified deep veins of unspecified proximal lower extremity: Secondary | ICD-10-CM

## 2015-04-25 MED ORDER — RIVAROXABAN 20 MG PO TABS
20.0000 mg | ORAL_TABLET | Freq: Every day | ORAL | Status: DC
Start: 2015-04-25 — End: 2015-07-24

## 2015-04-25 NOTE — Telephone Encounter (Signed)
Wife called requesting a new prescription be sent for the Xarelto. Prescription sent.  She also mentioned that patient has some bruising to his lower legs. He has no other signs of bleeding. Dr Myna HidalgoEnnever notified and he just wants patient to observe for now and call the office back if symptom get worse, or if other bleeding occurs. Wife is in understanding.

## 2015-05-01 ENCOUNTER — Other Ambulatory Visit: Payer: Self-pay | Admitting: Family Medicine

## 2015-05-01 MED ORDER — TRAMADOL HCL 50 MG PO TABS: 50.0000 mg | ORAL_TABLET | Freq: Three times a day (TID) | ORAL | Status: DC | PRN

## 2015-05-18 ENCOUNTER — Other Ambulatory Visit (HOSPITAL_BASED_OUTPATIENT_CLINIC_OR_DEPARTMENT_OTHER): Payer: Medicare Other

## 2015-05-18 ENCOUNTER — Encounter: Payer: Self-pay | Admitting: Family

## 2015-05-18 ENCOUNTER — Ambulatory Visit (HOSPITAL_BASED_OUTPATIENT_CLINIC_OR_DEPARTMENT_OTHER): Payer: Medicare Other | Admitting: Family

## 2015-05-18 VITALS — BP 128/60 | HR 74 | Temp 98.3°F | Resp 14 | Ht 68.0 in | Wt 175.0 lb

## 2015-05-18 DIAGNOSIS — I82401 Acute embolism and thrombosis of unspecified deep veins of right lower extremity: Secondary | ICD-10-CM | POA: Diagnosis not present

## 2015-05-18 DIAGNOSIS — D6862 Lupus anticoagulant syndrome: Secondary | ICD-10-CM

## 2015-05-18 LAB — COMPREHENSIVE METABOLIC PANEL
ALK PHOS: 97 U/L (ref 40–150)
ALT: 17 U/L (ref 0–55)
ANION GAP: 9 meq/L (ref 3–11)
AST: 14 U/L (ref 5–34)
Albumin: 3.7 g/dL (ref 3.5–5.0)
BILIRUBIN TOTAL: 0.42 mg/dL (ref 0.20–1.20)
BUN: 23 mg/dL (ref 7.0–26.0)
CALCIUM: 10 mg/dL (ref 8.4–10.4)
CO2: 24 meq/L (ref 22–29)
CREATININE: 1.6 mg/dL — AB (ref 0.7–1.3)
Chloride: 102 mEq/L (ref 98–109)
EGFR: 40 mL/min/{1.73_m2} — AB (ref >90)
Glucose: 209 mg/dl — ABNORMAL HIGH (ref 70–140)
Potassium: 4.7 mEq/L (ref 3.5–5.1)
SODIUM: 135 meq/L — AB (ref 136–145)
TOTAL PROTEIN: 7.5 g/dL (ref 6.4–8.3)

## 2015-05-18 LAB — CBC WITH DIFFERENTIAL (CANCER CENTER ONLY)
BASO#: 0 10*3/uL (ref 0.0–0.2)
BASO%: 0.4 % (ref 0.0–2.0)
EOS%: 3.7 % (ref 0.0–7.0)
Eosinophils Absolute: 0.3 10*3/uL (ref 0.0–0.5)
HEMATOCRIT: 43.4 % (ref 38.7–49.9)
HEMOGLOBIN: 14.9 g/dL (ref 13.0–17.1)
LYMPH#: 1.7 10*3/uL (ref 0.9–3.3)
LYMPH%: 25.5 % (ref 14.0–48.0)
MCH: 31.8 pg (ref 28.0–33.4)
MCHC: 34.3 g/dL (ref 32.0–35.9)
MCV: 93 fL (ref 82–98)
MONO#: 0.4 10*3/uL (ref 0.1–0.9)
MONO%: 5.3 % (ref 0.0–13.0)
NEUT%: 65.1 % (ref 40.0–80.0)
NEUTROS ABS: 4.5 10*3/uL (ref 1.5–6.5)
Platelets: 220 10*3/uL (ref 145–400)
RBC: 4.69 10*6/uL (ref 4.20–5.70)
RDW: 13.2 % (ref 11.1–15.7)
WBC: 6.8 10*3/uL (ref 4.0–10.0)

## 2015-05-18 LAB — D-DIMER, QUANTITATIVE (NOT AT ARMC): D DIMER QUANT: 0.58 ug{FEU}/mL — AB (ref 0.00–0.48)

## 2015-05-18 LAB — CHCC SATELLITE - SMEAR

## 2015-05-18 NOTE — Progress Notes (Signed)
Hematology and Oncology Follow Up Visit  Allen Jones [id number] Feb 05, 1941 74 y.o. 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 He is doing well on Xarelto. No episodes of bleeding or bruising. His right leg swelling has improved. He has some pain in the right knee due from a past surgery. He has no other complaint of discomfort.  He had a positive lupus anticoagulant with his last visit.  His doppler in November showed an extensive thrombus from the femoral vein down to the calf veins. No fever, chills, n/v, cough, rash, dizziness, chest pain, palpitations, abdominal pain or changes in bowel or bladder habits. He takes Miralax daily to help prevent constipation.  He has some occasional SOB and fatigue at times that resolves with taking a moment to rest.  No numbness or tingling in his extremities.  He is eating well but states that he should be drinking more. I encouraged him to stay well hydrated.  Medications:    Medication List       This list is accurate as of: 05/18/15 11:00 AM.  Always use your most recent med list.               calcium-vitamin D 500-200 MG-UNIT tablet  Commonly known as:  OSCAL WITH D  Take 1 tablet by mouth 2 (two) times daily.     celecoxib 100 MG capsule  Commonly known as:  CELEBREX  Take 2 capsules (200 mg total) by mouth 2 (two) times daily.     fluticasone 50 MCG/ACT nasal spray  Commonly known as:  FLONASE  Place 1 spray into both nostrils 2 (two) times daily.     GLIPIZIDE XL 2.5 MG 24 hr tablet  Generic drug:  glipiZIDE  TAKE 1 BY MOUTH DAILY WITH BREAKFAST     hydrochlorothiazide 12.5 MG tablet  Commonly known as:  HYDRODIURIL  TAKE 1 BY MOUTH DAILY     hydroxypropyl methylcellulose / hypromellose 2.5 % ophthalmic solution  Commonly known as:  ISOPTO TEARS / GONIOVISC   1 drop.     ketoconazole 2 % cream  Commonly known as:  NIZORAL  Apply topically daily.     olmesartan 40 MG tablet  Commonly known as:  BENICAR  Take 1 tablet (40 mg total) by mouth daily.     omeprazole 40 MG capsule  Commonly known as:  PRILOSEC  TAKE 1 BY MOUTH DAILY     ranitidine 150 MG tablet  Commonly known as:  ZANTAC  TAKE 1 BY MOUTH TWICE DAILY     rivaroxaban 20 MG Tabs tablet  Commonly known as:  XARELTO  Take 1 tablet (20 mg total) by mouth daily with supper.     temazepam 15 MG capsule  Commonly known as:  RESTORIL  TAKE TWO CAPSULES BY MOUTH AT BEDTIME AS NEEDED FOR SLEEP     traMADol 50 MG tablet  Commonly known as:  ULTRAM  Take 1 tablet (50 mg total) by mouth every 8 (eight) hours as needed.        Allergies:  Allergies  Allergen Reactions  . Benadryl [Diphenhydramine Hcl]     hyper  . Fish Oil     Due to total knee   . Percocet [Oxycodone-Acetaminophen]     Hallucinations  . Tetrahydrazoline Hcl  Pink eye  . Vicodin [Hydrocodone-Acetaminophen]     High BP    Past Medical History, Surgical history, Social history, and Family History were reviewed and updated.  Review of Systems: All other 10 point review of systems is negative.   Physical Exam:  vitals were not taken for this visit.  Wt Readings from Last 3 Encounters:  04/17/15 179 lb (81.194 kg)  04/14/15 180 lb 4 oz (81.761 kg)  01/13/15 181 lb 8 oz (82.328 kg)    Ocular: Sclerae unicteric, pupils equal, round and reactive to light Ear-nose-throat: Oropharynx clear, dentition fair Lymphatic: No cervical supraclavicular or axillary adenopathy Lungs no rales or rhonchi, good excursion bilaterally Heart regular rate and rhythm, no murmur appreciated Abd soft, nontender, positive bowel sounds MSK no focal spinal tenderness, no joint edema Neuro: non-focal, well-oriented, appropriate affect Breasts: Deferred  Lab Results  Component Value Date   WBC 10.4* 04/17/2015   HGB  15.3 04/17/2015   HCT 44.6 04/17/2015   MCV 93 04/17/2015   PLT 198 04/17/2015   No results found for: FERRITIN, IRON, TIBC, UIBC, IRONPCTSAT Lab Results  Component Value Date   RBC 4.80 04/17/2015   No results found for: KPAFRELGTCHN, LAMBDASER, KAPLAMBRATIO No results found for: IGGSERUM, IGA, IGMSERUM No results found for: Marda Stalker, SPEI   Chemistry      Component Value Date/Time   NA 138 04/17/2015 1449   NA 133* 04/14/2015 1108   K 4.9* 04/17/2015 1449   K 5.1 04/14/2015 1108   CL 106 04/17/2015 1449   CL 101 04/14/2015 1108   CO2 22 04/17/2015 1449   CO2 27 04/14/2015 1108   BUN 23* 04/17/2015 1449   BUN 31* 04/14/2015 1108   CREATININE 1.8* 04/17/2015 1449   CREATININE 1.82* 04/14/2015 1108      Component Value Date/Time   CALCIUM 9.7 04/17/2015 1449   CALCIUM 9.8 04/14/2015 1108   ALKPHOS 91* 04/17/2015 1449   ALKPHOS 94 04/14/2015 1108   AST 21 04/17/2015 1449   AST 19 04/14/2015 1108   ALT 21 04/17/2015 1449   ALT 23 04/14/2015 1108   BILITOT 0.80 04/17/2015 1449   BILITOT 0.7 04/14/2015 1108     Impression and Plan: Allen Jones is a 74 yo white male with an extensive thrombus in the right leg. He was positive for the lupus anticoagulant. He is doing well on Xarelto so far. The swelling and discomfort in his right leg have improved. He has had no episodes of bleeding.  He will continue on Xarelto for 6 months and then hopefully we can transition him to aspirin.  He will increase his daily fluid intake.  We will plan to see him back in 2 months for labs, follow-up and get a repeat doppler study of the right leg that same day.  He will contact us with any questions or concerns. We can certainly see him sooner if need be.   Verdie Mosher, NP 12/15/201611:00 AM

## 2015-06-26 ENCOUNTER — Other Ambulatory Visit: Payer: Self-pay | Admitting: Family Medicine

## 2015-06-27 NOTE — Telephone Encounter (Signed)
Medication filled to pharmacy as requested.   

## 2015-07-10 ENCOUNTER — Encounter: Payer: Self-pay | Admitting: Family Medicine

## 2015-07-10 ENCOUNTER — Telehealth: Payer: Self-pay | Admitting: Family Medicine

## 2015-07-10 MED ORDER — TEMAZEPAM 15 MG PO CAPS: ORAL_CAPSULE | ORAL | Status: DC

## 2015-07-10 NOTE — Progress Notes (Unsigned)
Faxed Rx to pahrmacy listed ([facility]).

## 2015-07-10 NOTE — Telephone Encounter (Signed)
Medication filled to pharmacy as requested.   

## 2015-07-10 NOTE — Telephone Encounter (Signed)
Ok to refill Restoril as previously written

## 2015-07-20 ENCOUNTER — Ambulatory Visit (HOSPITAL_BASED_OUTPATIENT_CLINIC_OR_DEPARTMENT_OTHER)
Admission: RE | Admit: 2015-07-20 | Discharge: 2015-07-20 | Disposition: A | Discharge status: Home / Self Care | Payer: Medicare Other | Source: Ambulatory Visit | Attending: Family | Admitting: Family

## 2015-07-20 ENCOUNTER — Encounter: Payer: Self-pay | Admitting: Family Medicine

## 2015-07-20 ENCOUNTER — Other Ambulatory Visit (HOSPITAL_BASED_OUTPATIENT_CLINIC_OR_DEPARTMENT_OTHER): Payer: Medicare Other

## 2015-07-20 ENCOUNTER — Encounter: Payer: Self-pay | Admitting: Hematology & Oncology

## 2015-07-20 ENCOUNTER — Ambulatory Visit (HOSPITAL_BASED_OUTPATIENT_CLINIC_OR_DEPARTMENT_OTHER): Payer: Medicare Other | Admitting: Hematology & Oncology

## 2015-07-20 VITALS — BP 144/59 | HR 74 | Temp 97.2°F | Resp 14 | Ht 68.0 in | Wt 177.0 lb

## 2015-07-20 DIAGNOSIS — I82401 Acute embolism and thrombosis of unspecified deep veins of right lower extremity: Secondary | ICD-10-CM

## 2015-07-20 DIAGNOSIS — I82431 Acute embolism and thrombosis of right popliteal vein: Secondary | ICD-10-CM | POA: Insufficient documentation

## 2015-07-20 DIAGNOSIS — D6862 Lupus anticoagulant syndrome: Secondary | ICD-10-CM

## 2015-07-20 DIAGNOSIS — I82411 Acute embolism and thrombosis of right femoral vein: Secondary | ICD-10-CM | POA: Insufficient documentation

## 2015-07-20 DIAGNOSIS — I82419 Acute embolism and thrombosis of unspecified femoral vein: Secondary | ICD-10-CM | POA: Diagnosis not present

## 2015-07-20 DIAGNOSIS — I82511 Chronic embolism and thrombosis of right femoral vein: Secondary | ICD-10-CM

## 2015-07-20 HISTORY — DX: Chronic embolism and thrombosis of right femoral vein: I82.511

## 2015-07-20 LAB — CBC WITH DIFFERENTIAL (CANCER CENTER ONLY)
BASO#: 0 10*3/uL (ref 0.0–0.2)
BASO%: 0.5 % (ref 0.0–2.0)
EOS ABS: 0.2 10*3/uL (ref 0.0–0.5)
EOS%: 2.7 % (ref 0.0–7.0)
HEMATOCRIT: 40.6 % (ref 38.7–49.9)
HEMOGLOBIN: 14.2 g/dL (ref 13.0–17.1)
LYMPH#: 2 10*3/uL (ref 0.9–3.3)
LYMPH%: 23.7 % (ref 14.0–48.0)
MCH: 32.2 pg (ref 28.0–33.4)
MCHC: 35 g/dL (ref 32.0–35.9)
MCV: 92 fL (ref 82–98)
MONO#: 0.7 10*3/uL (ref 0.1–0.9)
MONO%: 8.6 % (ref 0.0–13.0)
NEUT%: 64.5 % (ref 40.0–80.0)
NEUTROS ABS: 5.4 10*3/uL (ref 1.5–6.5)
Platelets: 208 10*3/uL (ref 145–400)
RBC: 4.41 10*6/uL (ref 4.20–5.70)
RDW: 13.1 % (ref 11.1–15.7)
WBC: 8.4 10*3/uL (ref 4.0–10.0)

## 2015-07-20 LAB — COMPREHENSIVE METABOLIC PANEL
ALBUMIN: 3.6 g/dL (ref 3.5–5.0)
ALK PHOS: 103 U/L (ref 40–150)
ALT: 16 U/L (ref 0–55)
AST: 15 U/L (ref 5–34)
Anion Gap: 8 mEq/L (ref 3–11)
BUN: 25.9 mg/dL (ref 7.0–26.0)
CALCIUM: 9.5 mg/dL (ref 8.4–10.4)
CHLORIDE: 105 meq/L (ref 98–109)
CO2: 25 mEq/L (ref 22–29)
Creatinine: 1.8 mg/dL — ABNORMAL HIGH (ref 0.7–1.3)
EGFR: 35 mL/min/{1.73_m2} — AB (ref >90)
GLUCOSE: 168 mg/dL — AB (ref 70–140)
POTASSIUM: 5 meq/L (ref 3.5–5.1)
SODIUM: 138 meq/L (ref 136–145)
Total Bilirubin: 0.35 mg/dL (ref 0.20–1.20)
Total Protein: 6.9 g/dL (ref 6.4–8.3)

## 2015-07-20 LAB — CHCC SATELLITE - SMEAR

## 2015-07-20 NOTE — Progress Notes (Signed)
Hematology and Oncology Follow Up Visit  Allen Jones [id number] [birth year] 75 y.o. 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 He is doing okay. His right leg is not as swollen or painful. He is able to do a little bit more.  We did go ahead and do a Doppler of his right leg. It does show that there is continued resolution of the thrombus. The common femoral vein has significant clearing of the thrombus. There is still thrombus in the popliteal vein with some early re-cannulization.  He is a well with the Xarelto. He does have some arthritic issues. He does take Celebrex.  He's had no bleeding problems. He's had no melena or bright blood per rectum. He's had no cough. He's had no shortness of breath. He's had no rashes.  Overall, his performance status is ECOG 2.  Medications:    Medication List       This list is accurate as of: 07/20/15 12:42 PM.  Always use your most recent med list.               calcium-vitamin D 500-200 MG-UNIT tablet  Commonly known as:  OSCAL WITH D  Take 1 tablet by mouth 2 (two) times daily.     celecoxib 100 MG capsule  Commonly known as:  CELEBREX  Take 2 capsules (200 mg total) by mouth 2 (two) times daily.     fluticasone 50 MCG/ACT nasal spray  Commonly known as:  FLONASE  USE 1 SPRAY INTO BOTH NOSTRILS 2 TIMES DAILY     GLIPIZIDE XL 2.5 MG 24 hr tablet  Generic drug:  glipiZIDE  TAKE 1 BY MOUTH DAILY WITH BREAKFAST     hydrochlorothiazide 12.5 MG tablet  Commonly known as:  HYDRODIURIL  TAKE 1 BY MOUTH DAILY     hydroxypropyl methylcellulose / hypromellose 2.5 % ophthalmic solution  Commonly known as:  ISOPTO TEARS / GONIOVISC  1 drop.     ketoconazole 2 % cream  Commonly known as:  NIZORAL  Apply topically daily.     olmesartan 40 MG tablet    Commonly known as:  BENICAR  Take 1 tablet (40 mg total) by mouth daily.     omeprazole 40 MG capsule  Commonly known as:  PRILOSEC  TAKE 1 BY MOUTH DAILY     ranitidine 150 MG tablet  Commonly known as:  ZANTAC  TAKE 1 BY MOUTH TWICE DAILY     rivaroxaban 20 MG Tabs tablet  Commonly known as:  XARELTO  Take 1 tablet (20 mg total) by mouth daily with supper.     temazepam 15 MG capsule  Commonly known as:  RESTORIL  TAKE TWO CAPSULES BY MOUTH AT BEDTIME AS NEEDED FOR SLEEP     traMADol 50 MG tablet  Commonly known as:  ULTRAM  Take 1 tablet (50 mg total) by mouth every 8 (eight) hours as needed.        Allergies:  Allergies  Allergen Reactions  . Benadryl [Diphenhydramine Hcl]     hyper  . Fish Oil     Due to total knee   . Percocet [Oxycodone-Acetaminophen]     Hallucinations  . Tetrahydrazoline Hcl     Pink eye  . Vicodin [Hydrocodone-Acetaminophen]  High BP    Past Medical History, Surgical history, Social history, and Family History were reviewed and updated.  Review of Systems: All other 10 point review of systems is negative.   Physical Exam:  height is  (1.727 m) and weight is 177 lb (80.287 kg). His oral temperature is 97.2 F (36.2 C). His blood pressure is 144/59 and his pulse is 74. His respiration is 14.   Wt Readings from Last 3 Encounters:  07/20/15 177 lb (80.287 kg)  05/18/15 175 lb (79.379 kg)  04/17/15 179 lb (81.194 kg)    Elderly white male in no obvious distress. Head exam shows no ocular or oral lesions. He has no palpable cervical or supraclavicular lymph nodes. Lungs are clear bilaterally. Cardiac exam regular rate and rhythm with no murmurs, rubs or bruits. Abdomen is soft. Has good bowel sounds. He has no fluid wave. There is no palpable liver or spleen tip. Back exam shows no tenderness over the spine, ribs or hips. Extremity shows no venous cord in the legs. Has some slight nonpitting edema of the right lower leg. He has  good pulses bilaterally. He has decent range of motion of his joints. Skin exam shows no rashes, ecchymoses or petechia. Neurological exam shows no focal neurological deficits.    Lab Results  Component Value Date   WBC 8.4 07/20/2015   HGB 14.2 07/20/2015   HCT 40.6 07/20/2015   MCV 92 07/20/2015   PLT 208 07/20/2015   No results found for: FERRITIN, IRON, TIBC, UIBC, IRONPCTSAT Lab Results  Component Value Date   RBC 4.41 07/20/2015   No results found for: KPAFRELGTCHN, LAMBDASER, KAPLAMBRATIO No results found for: Loel Lofty, IGMSERUM No results found for: Marda Stalker, SPEI   Chemistry      Component Value Date/Time   NA 135* 05/18/2015 1031   NA 138 04/17/2015 1449   NA 133* 04/14/2015 1108   K 4.7 05/18/2015 1031   K 4.9* 04/17/2015 1449   K 5.1 04/14/2015 1108   CL 106 04/17/2015 1449   CL 101 04/14/2015 1108   CO2 24 05/18/2015 1031   CO2 22 04/17/2015 1449   CO2 27 04/14/2015 1108   BUN 23.0 05/18/2015 1031   BUN 23* 04/17/2015 1449   BUN 31* 04/14/2015 1108   CREATININE 1.6* 05/18/2015 1031   CREATININE 1.8* 04/17/2015 1449   CREATININE 1.82* 04/14/2015 1108      Component Value Date/Time   CALCIUM 10.0 05/18/2015 1031   CALCIUM 9.7 04/17/2015 1449   CALCIUM 9.8 04/14/2015 1108   ALKPHOS 97 05/18/2015 1031   ALKPHOS 91* 04/17/2015 1449   ALKPHOS 94 04/14/2015 1108   AST 14 05/18/2015 1031   AST 21 04/17/2015 1449   AST 19 04/14/2015 1108   ALT 17 05/18/2015 1031   ALT 21 04/17/2015 1449   ALT 23 04/14/2015 1108   BILITOT 0.42 05/18/2015 1031   BILITOT 0.80 04/17/2015 1449   BILITOT 0.7 04/14/2015 1108     Impression and Plan: Allen Jones is a 75 yo white male with an extensive thrombus in the right leg. He was positive for the lupus anticoagulant. We are repeating this.  His thrombus is improving. I suspect that he may have some residual thrombus at the end of anticoagulation.  I may want  to keep him on blood thinner for 8 months or so. I think this would be reasonable.  I will repeat a follow-up Doppler we see  him back. We'll plan to get him back in about 4 months.   Josph Macho, MD 2/16/201712:42 PM

## 2015-07-21 LAB — D-DIMER, QUANTITATIVE (NOT AT ARMC): D-DIMER: 0.4 mg{FEU}/L (ref 0.00–0.49)

## 2015-07-24 ENCOUNTER — Telehealth: Payer: Self-pay | Admitting: *Deleted

## 2015-07-24 DIAGNOSIS — I825Y9 Chronic embolism and thrombosis of unspecified deep veins of unspecified proximal lower extremity: Secondary | ICD-10-CM

## 2015-07-24 MED ORDER — RANITIDINE HCL 150 MG PO TABS: ORAL_TABLET | ORAL | Status: DC

## 2015-07-24 MED ORDER — GLIPIZIDE ER 2.5 MG PO TB24: ORAL_TABLET | ORAL | Status: DC

## 2015-07-24 MED ORDER — HYDROCHLOROTHIAZIDE 12.5 MG PO TABS: ORAL_TABLET | ORAL | Status: AC

## 2015-07-24 MED ORDER — FLUTICASONE PROPIONATE 50 MCG/ACT NA SUSP: NASAL | Status: AC

## 2015-07-24 MED ORDER — OLMESARTAN MEDOXOMIL 40 MG PO TABS: 40.0000 mg | ORAL_TABLET | Freq: Every day | ORAL | Status: DC

## 2015-07-24 MED ORDER — TRAMADOL HCL 50 MG PO TABS: 50.0000 mg | ORAL_TABLET | Freq: Three times a day (TID) | ORAL | Status: DC | PRN

## 2015-07-24 MED ORDER — RIVAROXABAN 20 MG PO TABS: 20.0000 mg | ORAL_TABLET | Freq: Every day | ORAL | Status: DC

## 2015-07-24 MED ORDER — OMEPRAZOLE 40 MG PO CPDR: 40.0000 mg | DELAYED_RELEASE_CAPSULE | Freq: Every day | ORAL | Status: DC

## 2015-07-24 MED ORDER — TEMAZEPAM 15 MG PO CAPS: ORAL_CAPSULE | ORAL | Status: DC

## 2015-07-24 NOTE — Telephone Encounter (Signed)
Received Physician Mail Service Fax form , printed scripts; forwarded to provider/SLS 02/20

## 2015-07-27 ENCOUNTER — Encounter: Payer: Self-pay | Admitting: Hematology & Oncology

## 2015-07-27 ENCOUNTER — Other Ambulatory Visit: Payer: Self-pay | Admitting: General Practice

## 2015-07-27 ENCOUNTER — Other Ambulatory Visit: Payer: Self-pay | Admitting: *Deleted

## 2015-07-27 ENCOUNTER — Other Ambulatory Visit: Payer: Self-pay | Admitting: Family Medicine

## 2015-07-27 ENCOUNTER — Telehealth: Payer: Self-pay | Admitting: General Practice

## 2015-07-27 ENCOUNTER — Encounter: Payer: Self-pay | Admitting: General Practice

## 2015-07-27 ENCOUNTER — Encounter: Payer: Self-pay | Admitting: Family Medicine

## 2015-07-27 DIAGNOSIS — I825Y9 Chronic embolism and thrombosis of unspecified deep veins of unspecified proximal lower extremity: Secondary | ICD-10-CM

## 2015-07-27 DIAGNOSIS — F0390 Unspecified dementia without behavioral disturbance: Secondary | ICD-10-CM | POA: Insufficient documentation

## 2015-07-27 MED ORDER — RIVAROXABAN 20 MG PO TABS: 20.0000 mg | ORAL_TABLET | Freq: Every day | ORAL | Status: DC

## 2015-07-27 MED ORDER — TRAMADOL HCL 50 MG PO TABS: 50.0000 mg | ORAL_TABLET | Freq: Three times a day (TID) | ORAL | Status: DC | PRN

## 2015-07-27 MED ORDER — TEMAZEPAM 15 MG PO CAPS: ORAL_CAPSULE | ORAL | Status: DC

## 2015-07-27 NOTE — Telephone Encounter (Signed)
PA's began today for both Temazepam ([id number]) and olmesartan ([id number]) with covermymeds

## 2015-07-27 NOTE — Telephone Encounter (Signed)
Pt advised that this medication comes from the local pharmacy, [facility] order and cancelled it.  This medication was previously filled in November 2016 #60 with 0. Please advise on Qty and refills.

## 2015-07-27 NOTE — Telephone Encounter (Signed)
Received a Systems developer from [place] at [place] who advised that both of these medications were approved from 07/27/15 until 07/26/2016. Our office will receive a denial for temazepam only due to being submitted wrong initially on wrong form.

## 2015-07-27 NOTE — Telephone Encounter (Signed)
Medication filled to pharmacy as requested.   

## 2015-07-30 ENCOUNTER — Encounter: Payer: Self-pay | Admitting: Family Medicine

## 2015-07-31 MED ORDER — TRAMADOL HCL 50 MG PO TABS: 50.0000 mg | ORAL_TABLET | Freq: Three times a day (TID) | ORAL | Status: DC | PRN

## 2015-07-31 NOTE — Telephone Encounter (Signed)
Medication filled to pharmacy as requested.   

## 2015-08-01 ENCOUNTER — Telehealth: Payer: Self-pay | Admitting: Family Medicine

## 2015-08-01 NOTE — Telephone Encounter (Signed)
Called to inform patient of below and to schedule an OV with PCP. Left message to call back to schedule.

## 2015-08-01 NOTE — Telephone Encounter (Signed)
Agree.  Pt needs OV

## 2015-08-01 NOTE — Telephone Encounter (Signed)
Caller name: Self Relationship to patient: Can be reached: [phone]   Reason for call: Patient would like to have order for A1C entered so he can get it drawn tomorrow

## 2015-08-01 NOTE — Telephone Encounter (Signed)
Pt last diabetes follow up with PCP was 01/13/15. Pt needs an appt with PCP for a diabetes, cholesterol and Blood Pressure follow up Correct?  Cannot do just labs without PCP approval. Message routed to her.

## 2015-08-02 ENCOUNTER — Other Ambulatory Visit: Payer: Medicare Other

## 2015-08-02 NOTE — Telephone Encounter (Signed)
Called pt family and left a detailed message to inform that we cannot just do labs. Pt needs a follow up appointment with Dr. Beverely Low to check on Diabetes, Cholesterol, and BP.

## 2015-08-08 ENCOUNTER — Encounter: Payer: Self-pay | Admitting: Family Medicine

## 2015-08-09 ENCOUNTER — Ambulatory Visit: Payer: Medicare Other | Admitting: Family Medicine

## 2015-08-24 ENCOUNTER — Other Ambulatory Visit: Payer: Self-pay | Admitting: General Practice

## 2015-08-24 ENCOUNTER — Encounter: Payer: Self-pay | Admitting: Family Medicine

## 2015-08-24 MED ORDER — GLIPIZIDE ER 2.5 MG PO TB24: ORAL_TABLET | ORAL | Status: DC

## 2015-08-24 NOTE — Telephone Encounter (Signed)
Medication filled to pharmacy as requested.   

## 2015-09-18 ENCOUNTER — Encounter: Payer: Self-pay | Admitting: Family Medicine

## 2015-09-18 ENCOUNTER — Ambulatory Visit (INDEPENDENT_AMBULATORY_CARE_PROVIDER_SITE_OTHER): Payer: Medicare Other | Admitting: Family Medicine

## 2015-09-18 VITALS — BP 145/73 | HR 74 | Temp 97.6°F | Ht 68.0 in | Wt 180.4 lb

## 2015-09-18 DIAGNOSIS — E119 Type 2 diabetes mellitus without complications: Secondary | ICD-10-CM

## 2015-09-18 DIAGNOSIS — Z0000 Encounter for general adult medical examination without abnormal findings: Secondary | ICD-10-CM | POA: Diagnosis not present

## 2015-09-18 DIAGNOSIS — I499 Cardiac arrhythmia, unspecified: Secondary | ICD-10-CM

## 2015-09-18 DIAGNOSIS — Z13220 Encounter for screening for lipoid disorders: Secondary | ICD-10-CM

## 2015-09-18 DIAGNOSIS — R7989 Other specified abnormal findings of blood chemistry: Secondary | ICD-10-CM

## 2015-09-18 DIAGNOSIS — Z125 Encounter for screening for malignant neoplasm of prostate: Secondary | ICD-10-CM | POA: Diagnosis not present

## 2015-09-18 LAB — LIPID PANEL
CHOL/HDL RATIO: 6
CHOLESTEROL: 211 mg/dL — AB (ref 0–200)
HDL: 36.1 mg/dL — ABNORMAL LOW (ref >39.00)
NonHDL: 174.46
TRIGLYCERIDES: 241 mg/dL — AB (ref 0.0–149.0)
VLDL: 48.2 mg/dL — ABNORMAL HIGH (ref 0.0–40.0)

## 2015-09-18 LAB — HEMOGLOBIN A1C: HEMOGLOBIN A1C: 6.6 % — AB (ref 4.6–6.5)

## 2015-09-18 LAB — LDL CHOLESTEROL, DIRECT: Direct LDL: 138 mg/dL

## 2015-09-18 LAB — PSA, MEDICARE: PSA: 1.07 ng/mL (ref 0.10–4.00)

## 2015-09-18 NOTE — Progress Notes (Signed)
[place] at [facility] [address] [place], [place] [address] [phone]  Date:  09/18/2015   Name:  Allen Jones   DOB:  10-02-1940   MRN:  [id number]  PCP:  Annye Asa, MD    Chief Complaint: Annual Exam   History of Present Illness:  Allen Jones is a 75 y.o. very pleasant male patient who presents with the following:  Here today for a CPE- history of HTN, dementia, DM on glipizide, xarelto for chronic DVT. He has been a pt of Dr. Birdie Riddle but the drive to [place] is too difficult for them.  They will plan to see me now as PCP  He is seeing Dr. Marin Olp for his DVT - he is managing the xarelto. They may be able to stop this medication later on this summer.  He uses tramadol as needed for pain, and also celebrex.  This does help with his leg pain.    They do not check his sugar at home.  He has not noted any sx of low sugar  He uses temazepam at bedtime.  This does help him to sleep.  He does not have any sx of uncontrolled imsomnia He is feeling well.    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 He has one living daughter and 2 grands- they live in [place].  Another daughter is deceased.  He has been married to his current wife for 7 years- they are both widowed and have found a joyful relationship together. They met on [place].   He is due for an eye exam so they will do this soon  He gets his feet done every 3 weeks at a nail shop- they are doing a great job keeping his feet in shape.   He had been a pt with regional physicians in [place] in the past No falls, he is able to dress, bathe,and do his ADLs at home    Lab Results  Component Value Date   HGBA1C 6.5* 01/13/2015     Patient Active Problem List   Diagnosis Date Noted  . Dementia 07/27/2015  . 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 Right leg swelling 04/14/2015  . DM II (diabetes mellitus, type  II), controlled ([place]) 09/07/2013  . Allergic rhinitis 05/25/2013  . Testicular mass 04/07/2012  . Insomnia 04/07/2012  . Bronchitis 05/12/2011  . Hypertriglyceridemia 03/19/2011  . HTN (hypertension) 01/08/2011  . Osteopenia 01/08/2011  . OA (osteoarthritis) 01/08/2011  . GERD (gastroesophageal reflux disease) 01/08/2011  . General medical examination 01/08/2011    Past Medical History  Diagnosis Date  . Allergy   . Arthritis   . Hypertension   . Hyperlipidemia   . Diabetes mellitus without complication ([place])   . 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 Nasal septum surgery  1970  . Lumbar disc surgery  1987  . Knee arthroscopy  1998  . Bunionectomy  1999    Right foot  . Bladder stone removal  2000    prostate  . Hernia repair  2003    left inquinal  . Right knee replacement  2011    (possible antibiotic treatment prior to a dental or medical procedure)  . Prostate surgery  2002    Social History  Substance Use Topics  .  Smoking status: Never Smoker   . Smokeless tobacco: None  . Alcohol Use: No    Family History  Problem Relation Age of Onset  . Lung cancer Sister     smoker    Allergies  Allergen Reactions  . Benadryl [Diphenhydramine Hcl]     hyper  . Fish Oil     Due to total knee   . Percocet [Oxycodone-Acetaminophen]     Hallucinations  . Tetrahydrozoline Hcl     Pink eye  . Vicodin [Hydrocodone-Acetaminophen]     High BP    Medication list has been reviewed and updated.  Current Outpatient Prescriptions on File Prior to Visit  Medication Sig Dispense Refill  . calcium-vitamin D (OSCAL WITH D) 500-200 MG-UNIT per tablet Take 1 tablet by mouth 2 (two) times daily.      . celecoxib (CELEBREX) 100 MG capsule Take 2 capsules (200 mg total) by mouth 2 (two) times daily. 360 capsule 4  . fluticasone (FLONASE) 50 MCG/ACT nasal spray USE 1 SPRAY INTO BOTH  NOSTRILS 2 TIMES DAILY 48 g 1  . glipiZIDE (GLIPIZIDE XL) 2.5 MG 24 hr tablet TAKE 1 BY MOUTH DAILY WITH BREAKFAST 90 tablet 1  . hydrochlorothiazide (HYDRODIURIL) 12.5 MG tablet TAKE 1 BY MOUTH DAILY 90 tablet 1  . hydroxypropyl methylcellulose (ISOPTO TEARS) 2.5 % ophthalmic solution 1 drop.      Marland Kitchen ketoconazole (NIZORAL) 2 % cream Apply topically daily. 180 g 1  . olmesartan (BENICAR) 40 MG tablet Take 1 tablet (40 mg total) by mouth daily. 90 tablet 1  . omeprazole (PRILOSEC) 40 MG capsule Take 1 capsule (40 mg total) by mouth daily. 90 capsule 1  . ranitidine (ZANTAC) 150 MG tablet TAKE 1 BY MOUTH TWICE DAILY 180 tablet 1  . rivaroxaban (XARELTO) 20 MG TABS tablet Take 1 tablet (20 mg total) by mouth daily with supper. 90 tablet 1  . temazepam (RESTORIL) 15 MG capsule TAKE TWO CAPSULES BY MOUTH AT BEDTIME AS NEEDED FOR SLEEP 180 capsule 1  . traMADol (ULTRAM) 50 MG tablet Take 1 tablet (50 mg total) by mouth every 8 (eight) hours as needed. 270 tablet 0   No current facility-administered medications on file prior to visit.    Review of Systems:  As per HPI- otherwise negative.   Physical Examination: Filed Vitals:   09/18/15 1320  BP: 145/73  Pulse: 74  Temp: 97.6 F (36.4 C)   Filed Vitals:   09/18/15 1320  Height: _0  (1.727 m)  Weight: 180 lb 6.4 oz (81.829 kg)   Body mass index is 27.44 kg/(m^2). Ideal Body Weight: Weight in (lb) to have BMI = 25: 164.1  GEN: WDWN, NAD, Non-toxic, A & O x 3, looks well HEENT: Atraumatic, Normocephalic. Neck supple. No masses, No LAD.  Bilateral TM wnl, oropharynx normal.  PEERL,EOMI.   Ears and Nose: No external deformity. CV: irregular rhythm, No M/G/R. No JVD. No thrill. No extra heart sounds. PULM: CTA B, no wheezes, crackles, rhonchi. No retractions. No resp. distress. No accessory muscle use. ABD: S, NT, ND, +BS. No rebound. No HSM. EXTR: No c/c/e NEURO Normal gait.  PSYCH: Normally interactive. Conversant. Not depressed or  anxious appearing.  Calm demeanor.  Foot exam today- noraml  EKG: done to rule- out fib/ flutter. Compared with tracing from 2012.  Continues to show low voltage but OW normal SR with occasional PAC.s  BP Readings from Last 3 Encounters:  09/18/15 145/73  07/20/15 144/59  05/18/15 128/60  Assessment and Plan: Physical exam  Irregular heartbeat - Plan: EKG 12-Lead  Screening for prostate cancer - Plan: PSA, Medicare  Screening for hyperlipidemia - Plan: Lipid panel  Controlled type 2 diabetes mellitus without complication, without long-term current use of insulin ([place]) - Plan: Hemoglobin A1c  Wellness exam today- he is doing great, has no complaint of pain or discomfort. Labs as above Will check A1c to monitor his DM EKG shows SR with occasional ectopic beat- ok They will find contact info for his previous PCP and let me know- I would like to see if they have any other immunization info for him Plan recheck in 6 months, sooner if any problems arise   Signed Lamar Blinks, MD

## 2015-09-18 NOTE — Patient Instructions (Addendum)
Please do try and locate the name of your last physican (or the name of the office is enough). I want to call them and find out if you have any other immunizations on file  I will be in touch with your labs asap It was very nice to meet you today  Please do get an eye exam soon Your EKG looks ok today

## 2015-09-18 NOTE — Progress Notes (Signed)
Pre visit review using our clinic review tool, if applicable. No additional management support is needed unless otherwise documented below in the visit note. 

## 2015-09-22 ENCOUNTER — Encounter: Payer: Self-pay | Admitting: Hematology & Oncology

## 2015-11-10 ENCOUNTER — Encounter: Payer: Self-pay | Admitting: Hematology & Oncology

## 2015-11-17 ENCOUNTER — Other Ambulatory Visit: Payer: Medicare Other

## 2015-11-17 ENCOUNTER — Other Ambulatory Visit (HOSPITAL_BASED_OUTPATIENT_CLINIC_OR_DEPARTMENT_OTHER): Payer: Medicare Other

## 2015-11-17 ENCOUNTER — Ambulatory Visit (HOSPITAL_BASED_OUTPATIENT_CLINIC_OR_DEPARTMENT_OTHER): Payer: Medicare Other | Admitting: Hematology & Oncology

## 2015-11-17 ENCOUNTER — Ambulatory Visit: Payer: Medicare Other | Admitting: Hematology & Oncology

## 2015-11-17 ENCOUNTER — Encounter: Payer: Self-pay | Admitting: Hematology & Oncology

## 2015-11-17 ENCOUNTER — Ambulatory Visit (HOSPITAL_BASED_OUTPATIENT_CLINIC_OR_DEPARTMENT_OTHER)
Admission: RE | Admit: 2015-11-17 | Discharge: 2015-11-17 | Disposition: A | Discharge status: Home / Self Care | Payer: Medicare Other | Source: Ambulatory Visit | Attending: Hematology & Oncology | Admitting: Hematology & Oncology

## 2015-11-17 VITALS — BP 125/69 | HR 70 | Temp 97.3°F | Resp 14 | Ht 68.0 in | Wt 181.0 lb

## 2015-11-17 DIAGNOSIS — D6862 Lupus anticoagulant syndrome: Secondary | ICD-10-CM | POA: Diagnosis not present

## 2015-11-17 DIAGNOSIS — Z7901 Long term (current) use of anticoagulants: Secondary | ICD-10-CM | POA: Insufficient documentation

## 2015-11-17 DIAGNOSIS — I82511 Chronic embolism and thrombosis of right femoral vein: Secondary | ICD-10-CM

## 2015-11-17 DIAGNOSIS — I82891 Chronic embolism and thrombosis of other specified veins: Secondary | ICD-10-CM | POA: Insufficient documentation

## 2015-11-17 DIAGNOSIS — I82531 Chronic embolism and thrombosis of right popliteal vein: Secondary | ICD-10-CM | POA: Insufficient documentation

## 2015-11-17 DIAGNOSIS — I82401 Acute embolism and thrombosis of unspecified deep veins of right lower extremity: Secondary | ICD-10-CM

## 2015-11-17 LAB — CBC WITH DIFFERENTIAL (CANCER CENTER ONLY)
BASO#: 0.1 10*3/uL (ref 0.0–0.2)
BASO%: 0.7 % (ref 0.0–2.0)
EOS ABS: 0.4 10*3/uL (ref 0.0–0.5)
EOS%: 5.6 % (ref 0.0–7.0)
HCT: 43.5 % (ref 38.7–49.9)
HGB: 15.3 g/dL (ref 13.0–17.1)
LYMPH#: 1.7 10*3/uL (ref 0.9–3.3)
LYMPH%: 22.8 % (ref 14.0–48.0)
MCH: 32.3 pg (ref 28.0–33.4)
MCHC: 35.2 g/dL (ref 32.0–35.9)
MCV: 92 fL (ref 82–98)
MONO#: 0.6 10*3/uL (ref 0.1–0.9)
MONO%: 8.2 % (ref 0.0–13.0)
NEUT#: 4.6 10*3/uL (ref 1.5–6.5)
NEUT%: 62.7 % (ref 40.0–80.0)
PLATELETS: 211 10*3/uL (ref 145–400)
RBC: 4.73 10*6/uL (ref 4.20–5.70)
RDW: 13.1 % (ref 11.1–15.7)
WBC: 7.3 10*3/uL (ref 4.0–10.0)

## 2015-11-17 LAB — COMPREHENSIVE METABOLIC PANEL
ALT: 14 U/L (ref 0–55)
AST: 15 U/L (ref 5–34)
Albumin: 3.6 g/dL (ref 3.5–5.0)
Alkaline Phosphatase: 83 U/L (ref 40–150)
Anion Gap: 9 mEq/L (ref 3–11)
BILIRUBIN TOTAL: 0.46 mg/dL (ref 0.20–1.20)
BUN: 29.4 mg/dL — AB (ref 7.0–26.0)
CO2: 23 meq/L (ref 22–29)
CREATININE: 1.7 mg/dL — AB (ref 0.7–1.3)
Calcium: 9.5 mg/dL (ref 8.4–10.4)
Chloride: 107 mEq/L (ref 98–109)
EGFR: 38 mL/min/{1.73_m2} — AB (ref >90)
GLUCOSE: 189 mg/dL — AB (ref 70–140)
Potassium: 4.5 mEq/L (ref 3.5–5.1)
SODIUM: 139 meq/L (ref 136–145)
TOTAL PROTEIN: 7.1 g/dL (ref 6.4–8.3)

## 2015-11-17 NOTE — Progress Notes (Signed)
Hematology and Oncology Follow Up Visit  Allen Jones [id number] [birth year] 75 y.o. 11/17/2015   Principle Diagnosis:  DVT of the right leg Positive lupus anticoagulant   Current Therapy:   Xarelto 20 mg PO daily - to finish in 01/2016    Interim History:  Allen Jones is here today with his wife for follow-up. He is doing okay. His right leg is not as swollen or painful. He is able to do a little bit more. He has on basketball shoes. He's had these back to play basketball.  We did go ahead and do a Doppler of his right leg. It does show that there is continued resolution of the thrombus. The common femoral vein has significant clearing of the thrombus. There is still thrombus in the popliteal vein with some early re-cannulization. He is noted to have a persistent thrombus in the superficial femoral vein, popliteal vein and gastrocnemius vein.  We last checked him, he did have a lupus anticoagulant. I'm not sure if this is clinically significant. We will repeat this today. Of note, he is on Celebrex.  There has been no bleeding. He's had no bruising. His been no change in bowel or bladder habits.  He does have a little bit of a macular type rash on his right thigh. I'm not sure exactly as to what this is. I don't think it is libido reticularis. I don't think it reflects any kind of vasculitis. I told him to try some over-the-counter topical steroid.  Overall, his performance status is ECOG 1.  Medications:    Medication List       This list is accurate as of: 11/17/15  1:17 PM.  Always use your most recent med list.               calcium-vitamin D 500-200 MG-UNIT tablet  Commonly known as:  OSCAL WITH D  Take 1 tablet by mouth 2 (two) times daily.     celecoxib 100 MG capsule  Commonly known as:  CELEBREX  Take 2 capsules (200 mg total) by mouth 2 (two) times daily.     fluticasone 50 MCG/ACT nasal spray  Commonly known as:  FLONASE  USE 1 SPRAY INTO BOTH NOSTRILS 2 TIMES  DAILY     glipiZIDE 2.5 MG 24 hr tablet  Commonly known as:  GLIPIZIDE XL  TAKE 1 BY MOUTH DAILY WITH BREAKFAST     hydrochlorothiazide 12.5 MG tablet  Commonly known as:  HYDRODIURIL  TAKE 1 BY MOUTH DAILY     hydroxypropyl methylcellulose / hypromellose 2.5 % ophthalmic solution  Commonly known as:  ISOPTO TEARS / GONIOVISC  1 drop.     ketoconazole 2 % cream  Commonly known as:  NIZORAL  Apply topically daily.     olmesartan 40 MG tablet  Commonly known as:  BENICAR  Take 1 tablet (40 mg total) by mouth daily.     omeprazole 40 MG capsule  Commonly known as:  PRILOSEC  Take 1 capsule (40 mg total) by mouth daily.     ranitidine 150 MG tablet  Commonly known as:  ZANTAC  TAKE 1 BY MOUTH TWICE DAILY     rivaroxaban 20 MG Tabs tablet  Commonly known as:  XARELTO  Take 1 tablet (20 mg total) by mouth daily with supper.     temazepam 15 MG capsule  Commonly known as:  RESTORIL  TAKE TWO CAPSULES BY MOUTH AT BEDTIME AS NEEDED FOR SLEEP     traMADol  50 MG tablet  Commonly known as:  ULTRAM  Take 1 tablet (50 mg total) by mouth every 8 (eight) hours as needed.        Allergies:  Allergies  Allergen Reactions  . Benadryl [Diphenhydramine Hcl]     hyper  . Fish Oil     Due to total knee   . Percocet [Oxycodone-Acetaminophen]     Hallucinations  . Tetrahydrozoline Hcl     Pink eye  . Vicodin [Hydrocodone-Acetaminophen]     High BP    Past Medical History, Surgical history, Social history, and Family History were reviewed and updated.  Review of Systems: All other 10 point review of systems is negative.   Physical Exam:  height is  (1.727 m) and weight is 181 lb (82.101 kg). His oral temperature is 97.3 F (36.3 C). His blood pressure is 125/69 and his pulse is 70. His respiration is 14.   Wt Readings from Last 3 Encounters:  11/17/15 181 lb (82.101 kg)  09/18/15 180 lb 6.4 oz (81.829 kg)  07/20/15 177 lb (80.287 kg)    Elderly white male in no  obvious distress. Head exam shows no ocular or oral lesions. He has no palpable cervical or supraclavicular lymph nodes. Lungs are clear bilaterally. Cardiac exam regular rate and rhythm with no murmurs, rubs or bruits. Abdomen is soft. Has good bowel sounds. He has no fluid wave. There is no palpable liver or spleen tip. Back exam shows no tenderness over the spine, ribs or hips. Extremity shows no venous cord in the legs. Has some slight nonpitting edema of the right lower leg. He has good pulses bilaterally. He has decent range of motion of his joints. Skin exam shows no rashes, ecchymoses or petechia. Neurological exam shows no focal neurological deficits.    Lab Results  Component Value Date   WBC 7.3 11/17/2015   HGB 15.3 11/17/2015   HCT 43.5 11/17/2015   MCV 92 11/17/2015   PLT 211 11/17/2015   No results found for: FERRITIN, IRON, TIBC, UIBC, IRONPCTSAT Lab Results  Component Value Date   RBC 4.73 11/17/2015   No results found for: KPAFRELGTCHN, LAMBDASER, KAPLAMBRATIO No results found for: IGGSERUM, IGA, IGMSERUM No results found for: Marda Stalker, SPEI   Chemistry      Component Value Date/Time   NA 139 11/17/2015 1112   NA 138 04/17/2015 1449   NA 133* 04/14/2015 1108   K 4.5 11/17/2015 1112   K 4.9* 04/17/2015 1449   K 5.1 04/14/2015 1108   CL 106 04/17/2015 1449   CL 101 04/14/2015 1108   CO2 23 11/17/2015 1112   CO2 22 04/17/2015 1449   CO2 27 04/14/2015 1108   BUN 29.4* 11/17/2015 1112   BUN 23* 04/17/2015 1449   BUN 31* 04/14/2015 1108   CREATININE 1.7* 11/17/2015 1112   CREATININE 1.8* 04/17/2015 1449   CREATININE 1.82* 04/14/2015 1108      Component Value Date/Time   CALCIUM 9.5 11/17/2015 1112   CALCIUM 9.7 04/17/2015 1449   CALCIUM 9.8 04/14/2015 1108   ALKPHOS 83 11/17/2015 1112   ALKPHOS 91* 04/17/2015 1449   ALKPHOS 94 04/14/2015 1108   AST 15 11/17/2015 1112   AST 21 04/17/2015 1449   AST  19 04/14/2015 1108   ALT 14 11/17/2015 1112   ALT 21 04/17/2015 1449   ALT 23 04/14/2015 1108   BILITOT 0.46 11/17/2015 1112   BILITOT 0.80 04/17/2015 1449  BILITOT 0.7 04/14/2015 1108     Impression and Plan: Allen Jones is a 75 yo white male with an extensive thrombus in the right leg. He was positive for the lupus anticoagulant. We are repeating this.He is taking Celebrex.   His thrombus is improving. I suspect that he may have some residual thrombus at the end of anticoagulation.  At this point, we will finish full dose anticoagulation in August. I will then plan for 1 year of low-dose anticoagulation with 10 mg of Xarelto. I think this would definitely be worthwhile.  If he does have a persistent lupus anticoagulant, then I think about low-dose aspirin in addition to the Xarelto.  Not sure what to make of this superficial thrombus. I really cannot feel anything on his exam. He does not appear to be symptomatic.  I'll plan to see him back in a couple months.  Josph MachoENNEVER,Jeramy Dimmick R, MD 6/16/20171:17 PM

## 2015-11-18 LAB — D-DIMER, QUANTITATIVE: D-DIMER: 0.34 mg/L FEU (ref 0.00–0.49)

## 2015-11-20 ENCOUNTER — Encounter: Payer: Self-pay | Admitting: Family Medicine

## 2015-11-20 ENCOUNTER — Encounter: Payer: Self-pay | Admitting: Hematology & Oncology

## 2015-11-20 DIAGNOSIS — I825Z9 Chronic embolism and thrombosis of unspecified deep veins of unspecified distal lower extremity: Secondary | ICD-10-CM

## 2015-11-20 LAB — LUPUS ANTICOAGULANT PANEL
DRVVT CONFIRM: 1.8 ratio — AB (ref 0.8–1.2)
DRVVT MIX: 67.4 s — AB (ref 0.0–47.0)
DRVVT: 121.4 s — AB (ref 0.0–47.0)
Hexagonal Phase Phospholipid: 11 s (ref 0–11)
PTT-LA Incub Mix: 46.4 s — ABNORMAL HIGH (ref 0.0–40.6)
PTT-LA MIX: 39.8 s (ref 0.0–40.6)
PTT-LA: 44.4 s — ABNORMAL HIGH (ref 0.0–43.6)

## 2015-11-21 ENCOUNTER — Encounter: Payer: Self-pay | Admitting: Hematology & Oncology

## 2015-11-23 ENCOUNTER — Other Ambulatory Visit (INDEPENDENT_AMBULATORY_CARE_PROVIDER_SITE_OTHER): Payer: Medicare Other

## 2015-11-23 DIAGNOSIS — I825Z9 Chronic embolism and thrombosis of unspecified deep veins of unspecified distal lower extremity: Secondary | ICD-10-CM | POA: Diagnosis not present

## 2015-11-23 DIAGNOSIS — I82511 Chronic embolism and thrombosis of right femoral vein: Secondary | ICD-10-CM

## 2015-11-24 LAB — TSH: TSH: 1.18 u[IU]/mL (ref 0.35–4.50)

## 2015-11-24 LAB — T4, FREE: Free T4: 1.23 ng/dL (ref 0.60–1.60)

## 2015-11-24 NOTE — Addendum Note (Signed)
Addended by: Harley AltoPRICE, KRISTY M on: 11/24/2015 08:43 AM   Modules accepted: Orders

## 2015-11-24 NOTE — Addendum Note (Signed)
Addended by: Harley AltoPRICE, KRISTY M on: 11/24/2015 08:48 AM   Modules accepted: Orders

## 2016-01-07 IMAGING — US US EXTREM LOW VENOUS*R*
1 series · 13 of 24 positions shown · non-contrast
Comparison: None.

CLINICAL DATA: Right leg swelling for 2 months



[Series 1: us extrem low venous*right* · 0.08mm/px · 13 of 36 slices shown]
[im 1/36]
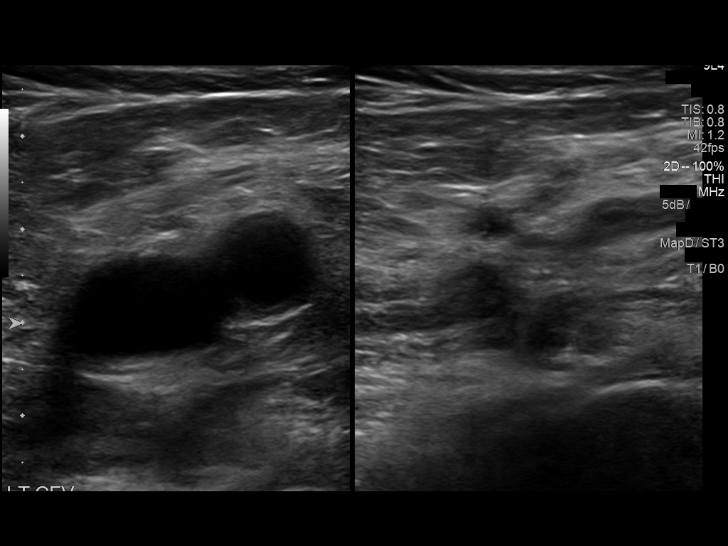
[im 4/36]
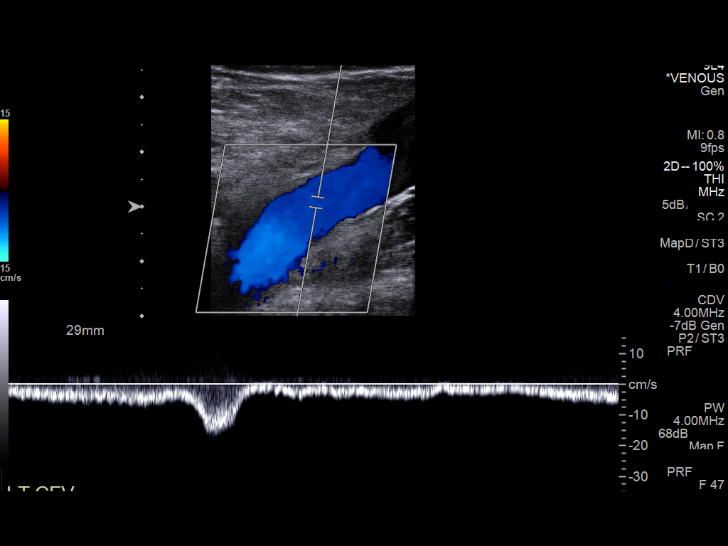
[im 7/36]
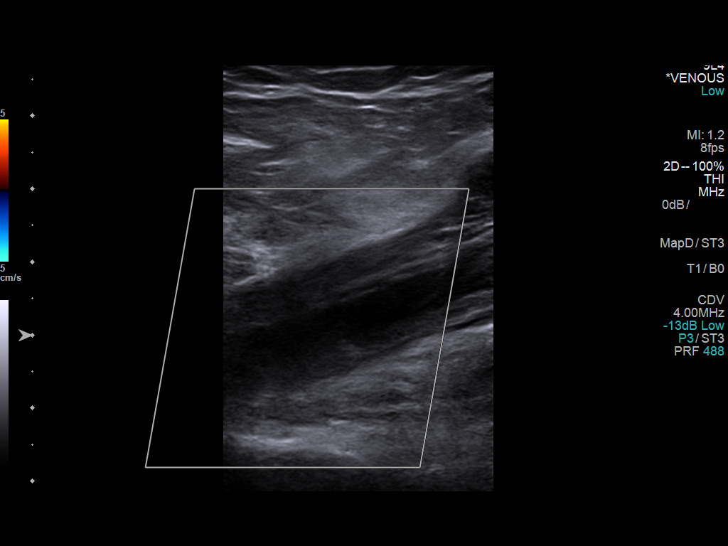
[im 10/36]
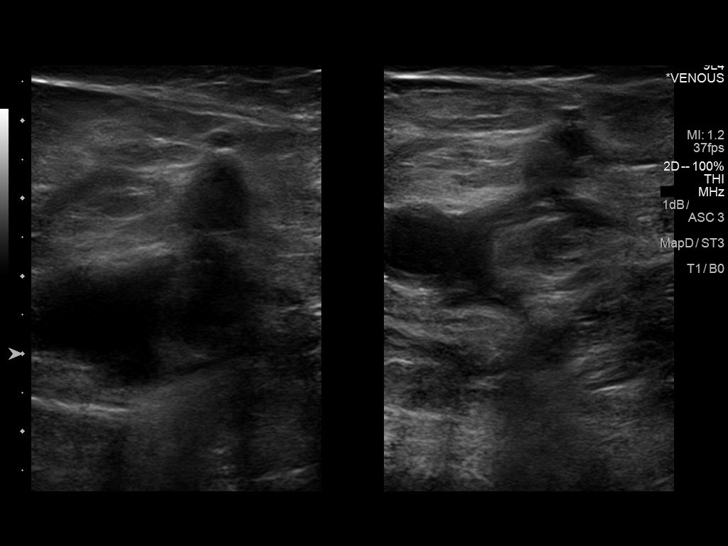
[im 13/36]
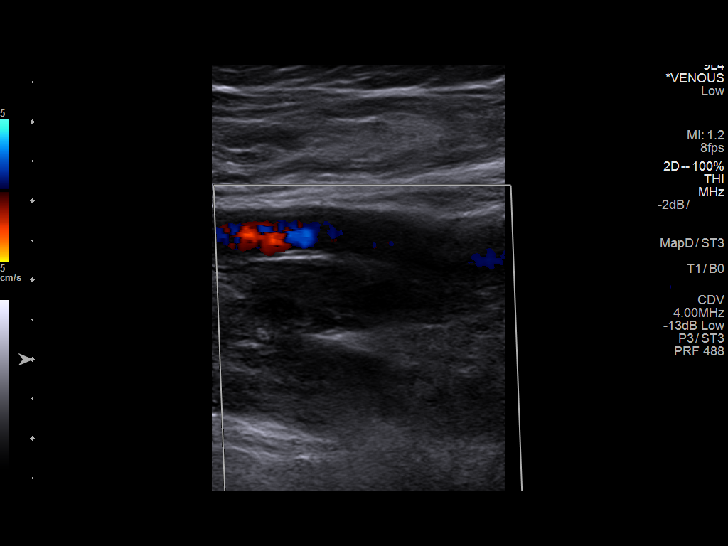
[im 16/36]
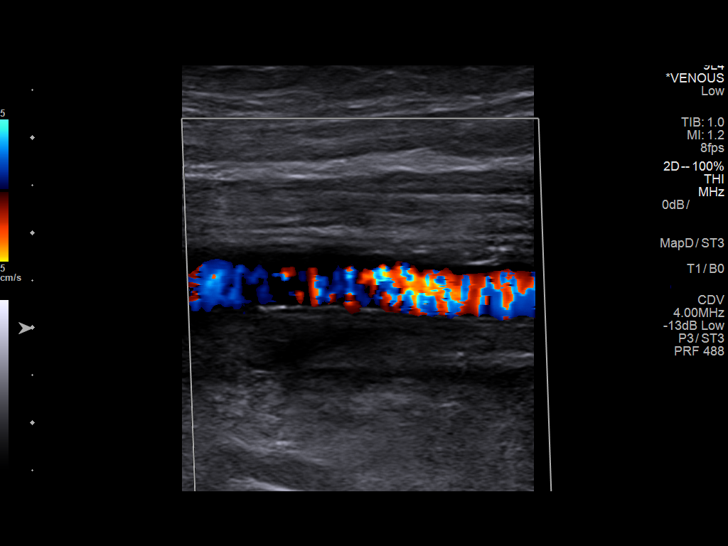
[im 19/36]
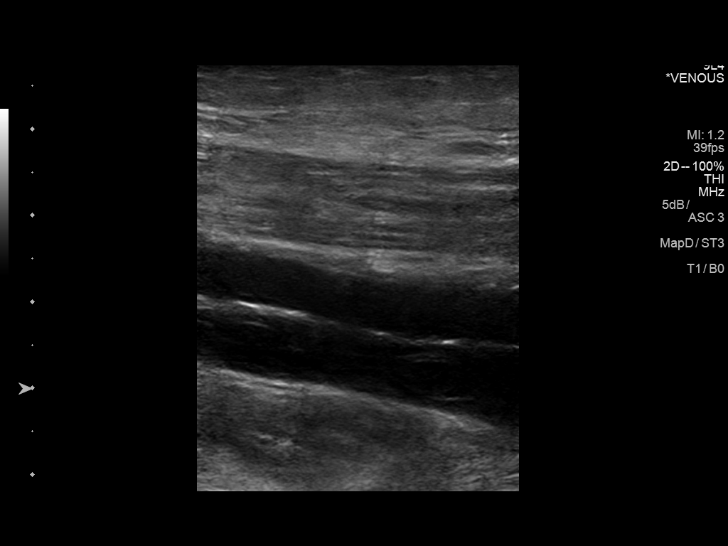
[im 20/36]
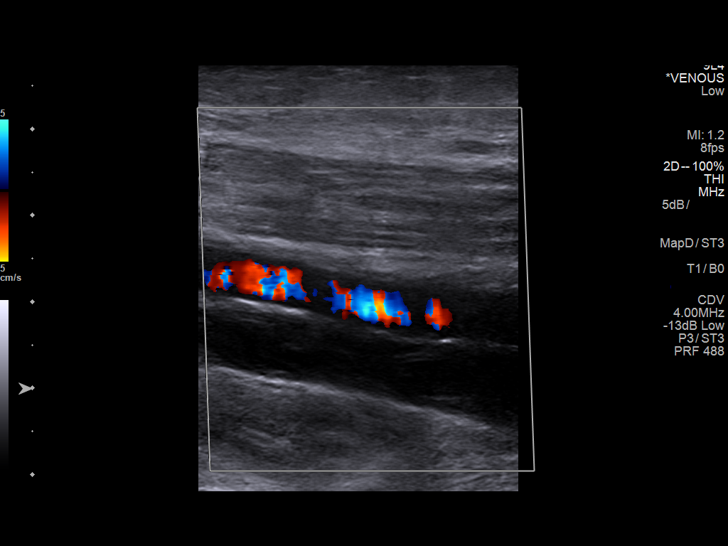
[im 23/36]
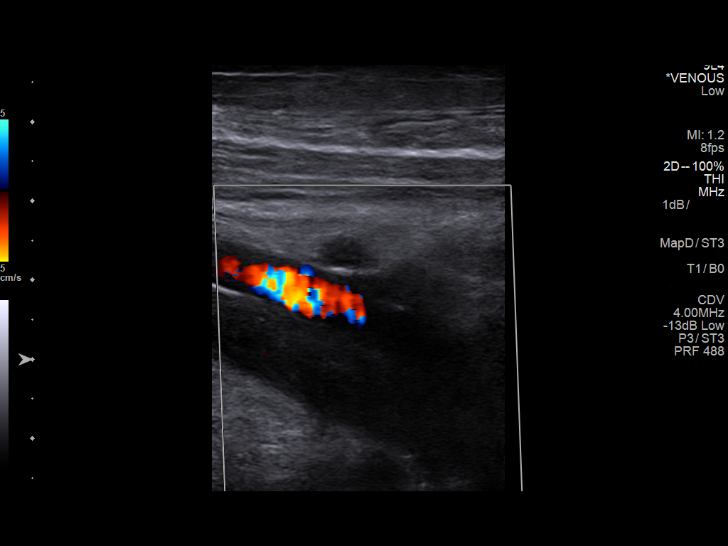
[im 26/36]
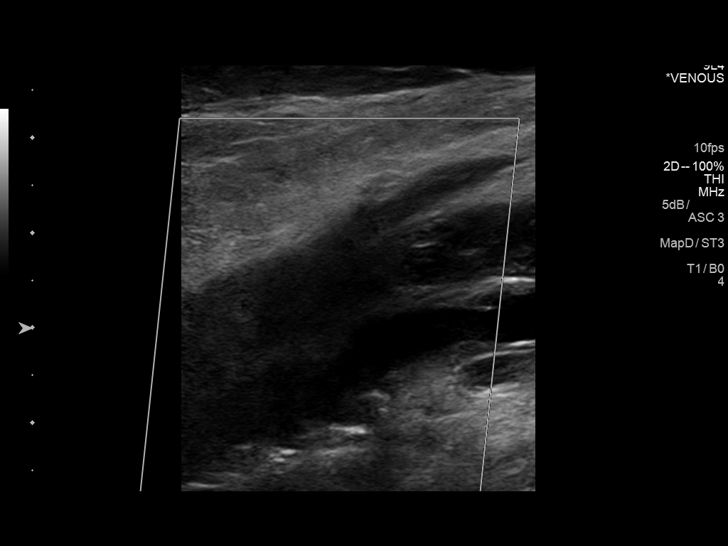
[im 29/36]
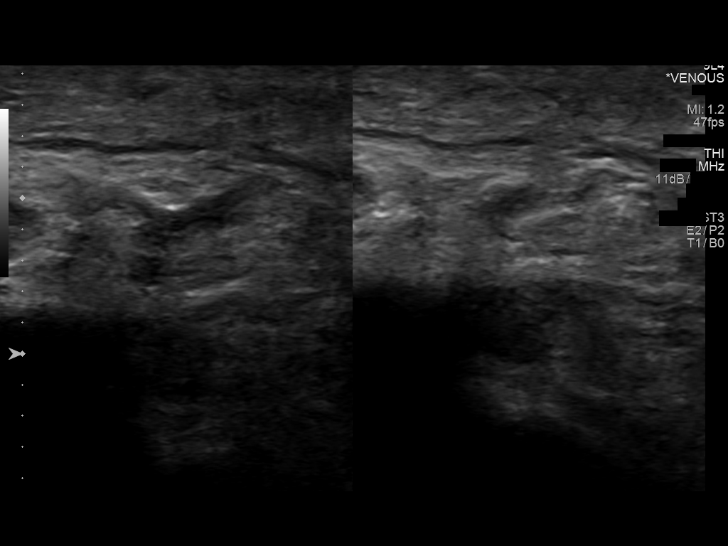
[im 32/36]
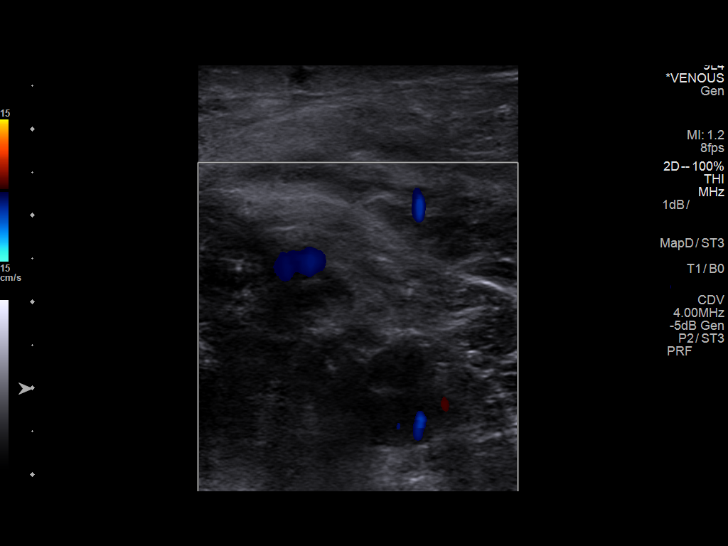
[im 36/36]
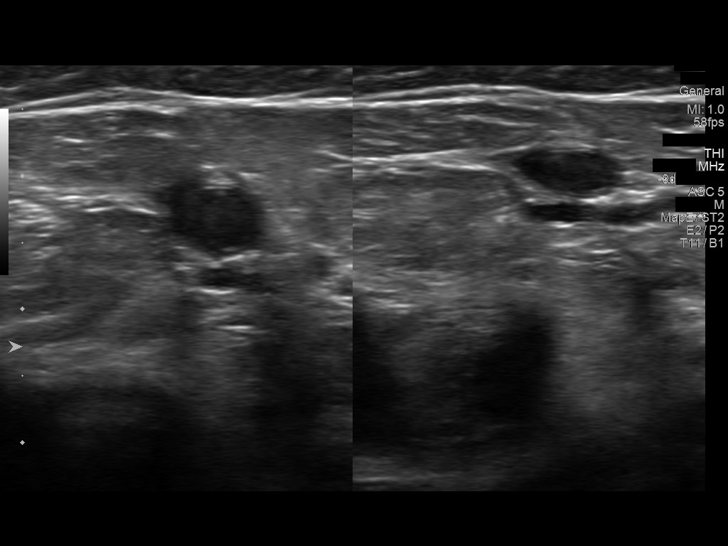

[13 of 24 positions shown; findings below may reference images not displayed]

FINDINGS: Contralateral Common Femoral Vein: Respiratory phasicity is normal
and symmetric with the symptomatic side. No evidence of thrombus.
Normal compressibility.

Common Femoral Vein: Echogenic thrombus is noted within the common
femoral vein with decreased compressibility

Saphenofemoral Junction: Echogenic thrombus is noted within the
common femoral vein with decreased compressibility

Profunda Femoral Vein: Echogenic thrombus is noted within the common
femoral vein with decreased compressibility

Femoral Vein: Echogenic thrombus is noted within the common femoral
vein with decreased compressibility

Popliteal Vein: Echogenic thrombus is noted within the common
femoral vein with decreased compressibility

Calf Veins: Echogenic thrombus is noted within the common femoral
vein with decreased compressibility

Superficial Great Saphenous Vein: No evidence of thrombus. Normal
compressibility and flow on color Doppler imaging.

Venous Reflux:  None.

Other Findings:  None.
IMPRESSION: Diffuse deep venous thrombosis throughout the right lower extremity
as described above

## 2016-01-12 ENCOUNTER — Other Ambulatory Visit: Payer: Self-pay | Admitting: Nurse Practitioner

## 2016-01-12 ENCOUNTER — Other Ambulatory Visit: Payer: Self-pay | Admitting: Family

## 2016-01-12 ENCOUNTER — Encounter: Payer: Self-pay | Admitting: Hematology & Oncology

## 2016-01-12 DIAGNOSIS — I82511 Chronic embolism and thrombosis of right femoral vein: Secondary | ICD-10-CM

## 2016-01-15 ENCOUNTER — Encounter: Payer: Self-pay | Admitting: Hematology & Oncology

## 2016-01-15 ENCOUNTER — Encounter: Payer: Self-pay | Admitting: Family Medicine

## 2016-01-17 ENCOUNTER — Ambulatory Visit (HOSPITAL_BASED_OUTPATIENT_CLINIC_OR_DEPARTMENT_OTHER)
Admission: RE | Admit: 2016-01-17 | Discharge: 2016-01-17 | Disposition: A | Discharge status: Home / Self Care | Payer: Medicare Other | Source: Ambulatory Visit | Attending: Family | Admitting: Family

## 2016-01-17 DIAGNOSIS — I82511 Chronic embolism and thrombosis of right femoral vein: Secondary | ICD-10-CM | POA: Insufficient documentation

## 2016-01-17 DIAGNOSIS — I82531 Chronic embolism and thrombosis of right popliteal vein: Secondary | ICD-10-CM | POA: Diagnosis not present

## 2016-01-19 ENCOUNTER — Other Ambulatory Visit: Payer: Self-pay | Admitting: Family

## 2016-01-19 ENCOUNTER — Encounter: Payer: Self-pay | Admitting: Hematology & Oncology

## 2016-01-19 ENCOUNTER — Other Ambulatory Visit (HOSPITAL_BASED_OUTPATIENT_CLINIC_OR_DEPARTMENT_OTHER): Payer: Medicare Other

## 2016-01-19 ENCOUNTER — Ambulatory Visit (HOSPITAL_BASED_OUTPATIENT_CLINIC_OR_DEPARTMENT_OTHER): Payer: Medicare Other | Admitting: Hematology & Oncology

## 2016-01-19 VITALS — BP 122/56 | HR 77 | Temp 97.6°F | Resp 16 | Ht 68.0 in | Wt 179.0 lb

## 2016-01-19 DIAGNOSIS — I82511 Chronic embolism and thrombosis of right femoral vein: Secondary | ICD-10-CM

## 2016-01-19 DIAGNOSIS — D6862 Lupus anticoagulant syndrome: Secondary | ICD-10-CM

## 2016-01-19 DIAGNOSIS — I82431 Acute embolism and thrombosis of right popliteal vein: Secondary | ICD-10-CM

## 2016-01-19 DIAGNOSIS — I825Z9 Chronic embolism and thrombosis of unspecified deep veins of unspecified distal lower extremity: Secondary | ICD-10-CM

## 2016-01-19 DIAGNOSIS — R21 Rash and other nonspecific skin eruption: Secondary | ICD-10-CM

## 2016-01-19 LAB — CBC WITH DIFFERENTIAL (CANCER CENTER ONLY)
BASO#: 0.1 10*3/uL (ref 0.0–0.2)
BASO%: 0.8 % (ref 0.0–2.0)
EOS ABS: 0.5 10*3/uL (ref 0.0–0.5)
EOS%: 6.1 % (ref 0.0–7.0)
HCT: 43.9 % (ref 38.7–49.9)
HGB: 15.5 g/dL (ref 13.0–17.1)
LYMPH#: 1.9 10*3/uL (ref 0.9–3.3)
LYMPH%: 24.2 % (ref 14.0–48.0)
MCH: 32.4 pg (ref 28.0–33.4)
MCHC: 35.3 g/dL (ref 32.0–35.9)
MCV: 92 fL (ref 82–98)
MONO#: 0.7 10*3/uL (ref 0.1–0.9)
MONO%: 8.2 % (ref 0.0–13.0)
NEUT#: 4.8 10*3/uL (ref 1.5–6.5)
NEUT%: 60.7 % (ref 40.0–80.0)
PLATELETS: 209 10*3/uL (ref 145–400)
RBC: 4.78 10*6/uL (ref 4.20–5.70)
RDW: 13.9 % (ref 11.1–15.7)
WBC: 8 10*3/uL (ref 4.0–10.0)

## 2016-01-19 LAB — COMPREHENSIVE METABOLIC PANEL
ALT: 13 U/L (ref 0–55)
ANION GAP: 9 meq/L (ref 3–11)
AST: 14 U/L (ref 5–34)
Albumin: 3.5 g/dL (ref 3.5–5.0)
Alkaline Phosphatase: 88 U/L (ref 40–150)
BUN: 28.1 mg/dL — ABNORMAL HIGH (ref 7.0–26.0)
CHLORIDE: 109 meq/L (ref 98–109)
CO2: 22 meq/L (ref 22–29)
Calcium: 9.5 mg/dL (ref 8.4–10.4)
Creatinine: 1.9 mg/dL — ABNORMAL HIGH (ref 0.7–1.3)
EGFR: 34 mL/min/{1.73_m2} — AB (ref >90)
Glucose: 208 mg/dl — ABNORMAL HIGH (ref 70–140)
Potassium: 4.4 mEq/L (ref 3.5–5.1)
Sodium: 140 mEq/L (ref 136–145)
Total Bilirubin: 0.41 mg/dL (ref 0.20–1.20)
Total Protein: 7.1 g/dL (ref 6.4–8.3)

## 2016-01-19 NOTE — Progress Notes (Signed)
Hematology and Oncology Follow Up Visit  Jodi Mourningroy A [place] [id number] 1941/04/19 75 y.o. 01/19/2016   Principle Diagnosis:  DVT of the right leg Positive lupus anticoagulant   Current Therapy:   Xarelto 20 mg PO daily - to finish in 01/2016    Interim History:  Mr. Allen Jones is here today with his wife for follow-up. He has a rash on his legs. He says he's had this since he's been on Xarelto. I'm not sure if this truly is the case. The rash is making him miserable. He says the rash comes and goes elsewhere.  He has tried some over-the-counter products for the rash. This has not helped all that much.   He did have a Doppler of his right leg 2 days ago. This showed a nonocclusive thrombus in the popliteal and distal femoral vein. This was unchanged.   He has not had any issues with nausea or vomiting. He has had no issues with weight loss or weight gain. He has had no pounds with the diabetes.   He is having a tough time sleeping because of this rash.   I told him that we will stop the Xarelto. He wants go back on Celebrex which has helped him  Overall, his performance status is ECOG 1.  Medications:    Medication List       Accurate as of 01/19/16 12:26 PM. Always use your most recent med list.          calcium-vitamin D 500-200 MG-UNIT tablet Commonly known as:  OSCAL WITH D Take 1 tablet by mouth 2 (two) times daily.   celecoxib 100 MG capsule Commonly known as:  CELEBREX Take 2 capsules (200 mg total) by mouth 2 (two) times daily.   fluticasone 50 MCG/ACT nasal spray Commonly known as:  FLONASE USE 1 SPRAY INTO BOTH NOSTRILS 2 TIMES DAILY   glipiZIDE 2.5 MG 24 hr tablet Commonly known as:  GLIPIZIDE XL TAKE 1 BY MOUTH DAILY WITH BREAKFAST   hydrochlorothiazide 12.5 MG tablet Commonly known as:  HYDRODIURIL TAKE 1 BY MOUTH DAILY   hydroxypropyl methylcellulose / hypromellose 2.5 % ophthalmic solution Commonly known as:  ISOPTO TEARS / GONIOVISC 1 drop.     ketoconazole 2 % cream Commonly known as:  NIZORAL Apply topically daily.   olmesartan 40 MG tablet Commonly known as:  BENICAR Take 1 tablet (40 mg total) by mouth daily.   omeprazole 40 MG capsule Commonly known as:  PRILOSEC Take 1 capsule (40 mg total) by mouth daily.   ranitidine 150 MG tablet Commonly known as:  ZANTAC TAKE 1 BY MOUTH TWICE DAILY   rivaroxaban 20 MG Tabs tablet Commonly known as:  XARELTO Take 1 tablet (20 mg total) by mouth daily with supper.   temazepam 15 MG capsule Commonly known as:  RESTORIL TAKE TWO CAPSULES BY MOUTH AT BEDTIME AS NEEDED FOR SLEEP   traMADol 50 MG tablet Commonly known as:  ULTRAM Take 1 tablet (50 mg total) by mouth every 8 (eight) hours as needed.       Allergies:  Allergies  Allergen Reactions  . Benadryl [Diphenhydramine Hcl]     hyper  . Fish Oil     Due to total knee   . Percocet [Oxycodone-Acetaminophen]     Hallucinations  . Tetrahydrozoline Hcl     Pink eye  . Vicodin [Hydrocodone-Acetaminophen]     High BP    Past Medical History, Surgical history, Social history, and Family History were reviewed and updated.  Review of Systems: All other 10 point review of systems is negative.   Physical Exam:  height is 5\' 8"  (1.727 m) and weight is 179 lb (81.2 kg). His oral temperature is 97.6 F (36.4 C). His blood pressure is 122/56 (abnormal) and his pulse is 77. His respiration is 16.   Wt Readings from Last 3 Encounters:  01/19/16 179 lb (81.2 kg)  11/17/15 181 lb (82.1 kg)  09/18/15 180 lb 6.4 oz (81.8 kg)    Elderly white male in no obvious distress. Head exam shows no ocular or oral lesions. He has no palpable cervical or supraclavicular lymph nodes. Lungs are clear bilaterally. Cardiac exam regular rate and rhythm with no murmurs, rubs or bruits. Abdomen is soft. Has good bowel sounds. He has no fluid wave. There is no palpable liver or spleen tip. Back exam shows no tenderness over the spine, ribs or  hips. Extremity shows no venous cord in the legs. Has some slight nonpitting edema of the right lower leg. He has good pulses bilaterally. He has decent range of motion of his joints. Skin exam shows no rashes, ecchymoses or petechia. Neurological exam shows no focal neurological deficits.    Lab Results  Component Value Date   WBC 8.0 01/19/2016   HGB 15.5 01/19/2016   HCT 43.9 01/19/2016   MCV 92 01/19/2016   PLT 209 01/19/2016   No results found for: FERRITIN, IRON, TIBC, UIBC, IRONPCTSAT Lab Results  Component Value Date   RBC 4.78 01/19/2016   No results found for: KPAFRELGTCHN, LAMBDASER, KAPLAMBRATIO No results found for: IGGSERUM, IGA, IGMSERUM No results found for: Marda StalkerOTALPROTELP, ALBUMINELP, A1GS, A2GS, BETS, BETA2SER, [place], MSPIKE, SPEI   Chemistry      Component Value Date/Time   NA 139 11/17/2015 1112   K 4.5 11/17/2015 1112   CL 106 04/17/2015 1449   CO2 23 11/17/2015 1112   BUN 29.4 (H) 11/17/2015 1112   CREATININE 1.7 (H) 11/17/2015 1112      Component Value Date/Time   CALCIUM 9.5 11/17/2015 1112   ALKPHOS 83 11/17/2015 1112   AST 15 11/17/2015 1112   ALT 14 11/17/2015 1112   BILITOT 0.46 11/17/2015 1112     Impression and Plan: Mr. Allen Jones is a 75 yo white male with an extensive thrombus in the right leg. He was positive for the lupus anticoagulant.  I'm not sure if this rash on his legs is from the Xarelto. I suppose it could be.  We will stop the Xarelto. He's been on Xarelto now for about 10 months. It would be nice to try to get him on maintenance low-dose Xarelto but I'll know if he is going to tolerate this with this rash.  If the Xarelto is causing the rash, the rash should be resolved in about 2 weeks or so.   He wants go back to his Celebrex. I think this would be okay.   I will like to see him back in about 3 months. I want to get another Doppler to look at the thrombus.  Josph MachoENNEVER,PETER R, MD 8/18/201712:26 PM

## 2016-01-23 ENCOUNTER — Encounter: Payer: Self-pay | Admitting: Family Medicine

## 2016-01-23 LAB — LUPUS ANTICOAGULANT PANEL
HEXAGONAL PHASE PHOSPHOLIPID: 7 s (ref 0–11)
PTT-LA MIX: 52.9 s — AB (ref 0.0–48.9)
PTT-LA: 62.7 s — AB (ref 0.0–51.9)
dRVVT Confirm: >6 ratio — ABNORMAL HIGH (ref 0.8–1.2)
dRVVT Mix: 113.8 s — ABNORMAL HIGH (ref 0.0–47.0)

## 2016-02-02 ENCOUNTER — Encounter: Payer: Self-pay | Admitting: Family Medicine

## 2016-02-05 ENCOUNTER — Encounter: Payer: Self-pay | Admitting: Family Medicine

## 2016-02-07 MED ORDER — OLMESARTAN MEDOXOMIL 40 MG PO TABS: 40.0000 mg | ORAL_TABLET | Freq: Every day | ORAL | 3 refills | Status: AC

## 2016-02-07 MED ORDER — CELECOXIB 100 MG PO CAPS: 200.0000 mg | ORAL_CAPSULE | Freq: Two times a day (BID) | ORAL | 3 refills | Status: AC

## 2016-02-09 ENCOUNTER — Other Ambulatory Visit: Payer: Self-pay | Admitting: Emergency Medicine

## 2016-02-12 ENCOUNTER — Encounter: Payer: Self-pay | Admitting: Family Medicine

## 2016-02-12 ENCOUNTER — Other Ambulatory Visit: Payer: Self-pay | Admitting: Emergency Medicine

## 2016-02-12 MED ORDER — TEMAZEPAM 15 MG PO CAPS: ORAL_CAPSULE | ORAL | 1 refills | Status: DC

## 2016-02-12 NOTE — Telephone Encounter (Signed)
Received refill request for Temazepam (RESTORIL) 15 MG capsule. Last office visit 09/18/15 and last refill 07/27/15. Is it ok to refill? Please advise.

## 2016-02-13 MED ORDER — TEMAZEPAM 15 MG PO CAPS: ORAL_CAPSULE | ORAL | 1 refills | Status: DC

## 2016-02-13 NOTE — Telephone Encounter (Signed)
Reviewed old rx and [facility].  He last got #180 of temazepam about 3 months ago, he takes 2 at bedtime

## 2016-03-20 ENCOUNTER — Ambulatory Visit (INDEPENDENT_AMBULATORY_CARE_PROVIDER_SITE_OTHER): Payer: Medicare Other | Admitting: Family Medicine

## 2016-03-20 ENCOUNTER — Encounter: Payer: Self-pay | Admitting: Family Medicine

## 2016-03-20 VITALS — BP 109/66 | HR 74 | Temp 97.5°F | Ht 68.0 in | Wt 181.8 lb

## 2016-03-20 DIAGNOSIS — R05 Cough: Secondary | ICD-10-CM | POA: Diagnosis not present

## 2016-03-20 DIAGNOSIS — R058 Other specified cough: Secondary | ICD-10-CM

## 2016-03-20 DIAGNOSIS — I825Z9 Chronic embolism and thrombosis of unspecified deep veins of unspecified distal lower extremity: Secondary | ICD-10-CM | POA: Diagnosis not present

## 2016-03-20 DIAGNOSIS — Z23 Encounter for immunization: Secondary | ICD-10-CM | POA: Diagnosis not present

## 2016-03-20 DIAGNOSIS — E119 Type 2 diabetes mellitus without complications: Secondary | ICD-10-CM | POA: Diagnosis not present

## 2016-03-20 MED ORDER — GLIPIZIDE ER 2.5 MG PO TB24: ORAL_TABLET | ORAL | 3 refills | Status: AC

## 2016-03-20 MED ORDER — BENZONATATE 100 MG PO CAPS: 100.0000 mg | ORAL_CAPSULE | Freq: Three times a day (TID) | ORAL | 0 refills | Status: DC | PRN

## 2016-03-20 NOTE — Progress Notes (Signed)
Pre visit review using our clinic review tool, if applicable. No additional management support is needed unless otherwise documented below in the visit note. 

## 2016-03-20 NOTE — Patient Instructions (Addendum)
It was very nice to see you today!  I will be in touch with your A1c.  Let's plan to recheck in 4-6 months.  Try the tessalon perles as needed for cough- swallow these whole, do not bite! Let me know if any fever or other concerns

## 2016-03-20 NOTE — Progress Notes (Signed)
[place] [facility] [address], [place], [place] [address] [phone]  Date:  03/20/2016   Name:  Allen Jones   DOB:  02/15/1941   MRN:  [id number]  PCP:  Lamar Blinks, MD    Chief Complaint: No chief complaint on file.   History of Present Illness:  Allen Jones is a 75 y.o. very pleasant male patient who presents with the following:  Here today for a recheck visit- CPE in April with the following HPI:  Here today for a CPE- history of HTN, dementia, DM on glipizide, xarelto for chronic DVT. He has been a pt of Dr. Birdie Riddle but the drive to [employer] is too difficult for them.  They will plan to see me now as PCP He is seeing Dr. Marin Olp for his DVT - he is managing the xarelto. They may be able to stop this medication later on this summer.  He uses tramadol as needed for pain, and also celebrex.  This does help with his leg pain.   They do not check his sugar at home.  He has not noted any sx of low sugar He uses temazepam at bedtime.  This does help him to sleep.  He does not have any sx of uncontrolled imsomnia He is feeling well.   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 He has one living daughter and 2 grands- they live in [place].  Another daughter is deceased.  He has been married to his current wife for 7 years- they are both widowed and have found a joyful relationship together. They met on [place].  He is due for an eye exam so they will do this soon  He gets his feet done every 3 weeks at a nail shop- they are doing a great job keeping his feet in shape.  He had been a pt with regional physicians in [place] in the past No falls, he is able to dress, bathe,and do his ADLs at home   He is due for an A1c today, flu shot.   He is not off xarelto- Dr. Marin Olp has been following his DVTs.  He is back on celebrex now that he is off xarelto and his aches and pains are much better.   He has noted a  dry cough for a few months.  He is not on an ACE.  He will notice some PND and drainage.  Cough is more so in the morning.  No fever or chills.  He does not feel ill overall  Never a smoker  They are seeing urology who follows his PSA and urethral stricture/ BPH  Patient Active Problem List   Diagnosis Date Noted  . Dementia 07/27/2015  . 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 Right leg swelling 04/14/2015  . DM II (diabetes mellitus, type II), controlled ([place]) 09/07/2013  . Allergic rhinitis 05/25/2013  . Testicular mass 04/07/2012  . Insomnia 04/07/2012  . Bronchitis 05/12/2011  . Hypertriglyceridemia 03/19/2011  . HTN (hypertension) 01/08/2011  . Osteopenia 01/08/2011  . OA (osteoarthritis) 01/08/2011  . GERD (gastroesophageal reflux disease) 01/08/2011  . General medical examination 01/08/2011    Past Medical History:  Diagnosis Date  . Allergy   . Arthritis   . 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 Diabetes mellitus without complication ([place])   . Hyperlipidemia   .  Hypertension     Past Surgical History:  Procedure Laterality Date  . BLADDER STONE REMOVAL  2000   prostate  . BUNIONECTOMY  1999   Right foot  . HERNIA REPAIR  2003   left inquinal  . KNEE ARTHROSCOPY  1998  . [place]  . NASAL SEPTUM SURGERY  1970  . PROSTATE SURGERY  2002  . right knee replacement  2011   (possible antibiotic treatment prior to a dental or medical procedure)    Social History  Substance Use Topics  . Smoking status: Never Smoker  . Smokeless tobacco: Never Used  . Alcohol use No    Family History  Problem Relation Age of Onset  . Lung cancer Sister     smoker    Allergies  Allergen Reactions  . Benadryl [Diphenhydramine Hcl]     hyper  . Fish Oil     Due to total knee   . Percocet [Oxycodone-Acetaminophen]     Hallucinations  . Tetrahydrozoline Hcl     Pink eye  .  Vicodin [Hydrocodone-Acetaminophen]     High BP    Medication list has been reviewed and updated.  Current Outpatient Prescriptions on File Prior to Visit  Medication Sig Dispense Refill  . calcium-vitamin D (OSCAL WITH D) 500-200 MG-UNIT per tablet Take 1 tablet by mouth 2 (two) times daily.      . celecoxib (CELEBREX) 100 MG capsule Take 2 capsules (200 mg total) by mouth 2 (two) times daily. 360 capsule 3  . fluticasone (FLONASE) 50 MCG/ACT nasal spray USE 1 SPRAY INTO BOTH NOSTRILS 2 TIMES DAILY 48 g 1  . glipiZIDE (GLIPIZIDE XL) 2.5 MG 24 hr tablet TAKE 1 BY MOUTH DAILY WITH BREAKFAST 90 tablet 1  . hydrochlorothiazide (HYDRODIURIL) 12.5 MG tablet TAKE 1 BY MOUTH DAILY 90 tablet 1  . hydroxypropyl methylcellulose (ISOPTO TEARS) 2.5 % ophthalmic solution 1 drop.      Marland Kitchen ketoconazole (NIZORAL) 2 % cream Apply topically daily. 180 g 1  . olmesartan (BENICAR) 40 MG tablet Take 1 tablet (40 mg total) by mouth daily. 90 tablet 3  . omeprazole (PRILOSEC) 40 MG capsule Take 1 capsule (40 mg total) by mouth daily. 90 capsule 1  . ranitidine (ZANTAC) 150 MG tablet TAKE 1 BY MOUTH TWICE DAILY 180 tablet 1  . temazepam (RESTORIL) 15 MG capsule TAKE TWO CAPSULES BY MOUTH AT BEDTIME AS NEEDED FOR SLEEP 180 capsule 1  . traMADol (ULTRAM) 50 MG tablet Take 1 tablet (50 mg total) by mouth every 8 (eight) hours as needed. 270 tablet 0   No current facility-administered medications on file prior to visit.     Review of Systems:  As per HPI- otherwise negative.   Physical Examination: Blood pressure 109/66, pulse 74, temperature 97.5 F (36.4 C), temperature source Oral, height '5\' 8"'$  (1.727 m), weight 181 lb 12.8 oz (82.5 kg), SpO2 94 %. Ideal Body Weight:    GEN: WDWN, NAD, Non-toxic, A & O x 3 HEENT: Atraumatic, Normocephalic. Neck supple. No masses, No LAD. Bilateral TM wnl, oropharynx normal.  PEERL,EOMI.   Ears and Nose: No external deformity. CV: RRR, No M/G/R. No JVD. No thrill. No extra  heart sounds. PULM: CTA B, no wheezes, crackles, rhonchi. No retractions. No resp. distress. No accessory muscle use. ABD: S, NT, ND EXTR: No c/c/e NEURO Normal gait.  PSYCH: Normally interactive. Conversant. Not depressed or anxious appearing.  Calm demeanor.    Assessment and Plan: Dry  cough - Plan: benzonatate (TESSALON) 100 MG capsule  Chronic deep vein thrombosis (DVT) of distal vein of lower extremity, unspecified laterality ([facility])  Controlled type 2 diabetes mellitus without complication, without long-term current use of insulin ([facility]) - Plan: Hemoglobin A1C, glipiZIDE (GLIPIZIDE XL) 2.5 MG 24 hr tablet  Immunization due  Here today for a recheck visit Will get A1c today Refilled his glipizide Flu shot today Tessalon perles for cough- cannot use atrovent nasal due to BPH  Signed Lamar Blinks, MD

## 2016-03-21 LAB — HEMOGLOBIN A1C: HEMOGLOBIN A1C: 6.4 % (ref 4.6–6.5)

## 2016-03-22 ENCOUNTER — Other Ambulatory Visit: Payer: Self-pay | Admitting: Emergency Medicine

## 2016-03-22 ENCOUNTER — Encounter: Payer: Self-pay | Admitting: Family Medicine

## 2016-03-22 NOTE — Addendum Note (Signed)
Addended by: Cammy CopaHANDLER, Anay Walter N on: 03/22/2016 08:23 AM   Modules accepted: Orders

## 2016-04-03 ENCOUNTER — Encounter: Payer: Self-pay | Admitting: Family Medicine

## 2016-04-03 DIAGNOSIS — R05 Cough: Secondary | ICD-10-CM

## 2016-04-03 DIAGNOSIS — R058 Other specified cough: Secondary | ICD-10-CM

## 2016-04-04 MED ORDER — BENZONATATE 100 MG PO CAPS: 100.0000 mg | ORAL_CAPSULE | Freq: Three times a day (TID) | ORAL | 0 refills | Status: AC | PRN

## 2016-04-05 ENCOUNTER — Other Ambulatory Visit: Payer: Self-pay | Admitting: Family Medicine

## 2016-04-05 ENCOUNTER — Encounter: Payer: Self-pay | Admitting: Family Medicine

## 2016-04-05 DIAGNOSIS — R058 Other specified cough: Secondary | ICD-10-CM

## 2016-04-05 DIAGNOSIS — R05 Cough: Secondary | ICD-10-CM

## 2016-04-17 ENCOUNTER — Ambulatory Visit (HOSPITAL_BASED_OUTPATIENT_CLINIC_OR_DEPARTMENT_OTHER)
Admission: RE | Admit: 2016-04-17 | Discharge: 2016-04-17 | Disposition: A | Discharge status: Home / Self Care | Payer: Medicare Other | Source: Ambulatory Visit | Attending: Hematology & Oncology | Admitting: Hematology & Oncology

## 2016-04-17 DIAGNOSIS — I82511 Chronic embolism and thrombosis of right femoral vein: Secondary | ICD-10-CM | POA: Insufficient documentation

## 2016-04-22 ENCOUNTER — Telehealth: Payer: Self-pay | Admitting: Family Medicine

## 2016-04-22 ENCOUNTER — Other Ambulatory Visit (HOSPITAL_BASED_OUTPATIENT_CLINIC_OR_DEPARTMENT_OTHER): Payer: Medicare Other

## 2016-04-22 ENCOUNTER — Ambulatory Visit (HOSPITAL_BASED_OUTPATIENT_CLINIC_OR_DEPARTMENT_OTHER): Payer: Medicare Other | Admitting: Family

## 2016-04-22 VITALS — BP 166/81 | HR 67 | Temp 97.0°F | Resp 20 | Wt 183.0 lb

## 2016-04-22 DIAGNOSIS — D6862 Lupus anticoagulant syndrome: Secondary | ICD-10-CM

## 2016-04-22 DIAGNOSIS — R760 Raised antibody titer: Secondary | ICD-10-CM

## 2016-04-22 DIAGNOSIS — I82511 Chronic embolism and thrombosis of right femoral vein: Secondary | ICD-10-CM | POA: Diagnosis not present

## 2016-04-22 LAB — COMPREHENSIVE METABOLIC PANEL
ALT: 13 U/L (ref 0–55)
ANION GAP: 10 meq/L (ref 3–11)
AST: 14 U/L (ref 5–34)
Albumin: 3.6 g/dL (ref 3.5–5.0)
Alkaline Phosphatase: 104 U/L (ref 40–150)
BILIRUBIN TOTAL: 0.49 mg/dL (ref 0.20–1.20)
BUN: 20.5 mg/dL (ref 7.0–26.0)
CALCIUM: 9.8 mg/dL (ref 8.4–10.4)
CO2: 22 mEq/L (ref 22–29)
CREATININE: 1.5 mg/dL — AB (ref 0.7–1.3)
Chloride: 107 mEq/L (ref 98–109)
EGFR: 45 mL/min/{1.73_m2} — AB (ref >90)
Glucose: 231 mg/dl — ABNORMAL HIGH (ref 70–140)
Potassium: 4.4 mEq/L (ref 3.5–5.1)
SODIUM: 138 meq/L (ref 136–145)
TOTAL PROTEIN: 7.2 g/dL (ref 6.4–8.3)

## 2016-04-22 LAB — CBC WITH DIFFERENTIAL (CANCER CENTER ONLY)
BASO#: 0 10*3/uL (ref 0.0–0.2)
BASO%: 0.4 % (ref 0.0–2.0)
EOS%: 3.9 % (ref 0.0–7.0)
Eosinophils Absolute: 0.3 10*3/uL (ref 0.0–0.5)
HCT: 44.1 % (ref 38.7–49.9)
HEMOGLOBIN: 15.4 g/dL (ref 13.0–17.1)
LYMPH#: 2 10*3/uL (ref 0.9–3.3)
LYMPH%: 25.9 % (ref 14.0–48.0)
MCH: 32.1 pg (ref 28.0–33.4)
MCHC: 34.9 g/dL (ref 32.0–35.9)
MCV: 92 fL (ref 82–98)
MONO#: 0.6 10*3/uL (ref 0.1–0.9)
MONO%: 7.9 % (ref 0.0–13.0)
NEUT%: 61.9 % (ref 40.0–80.0)
NEUTROS ABS: 4.8 10*3/uL (ref 1.5–6.5)
Platelets: 183 10*3/uL (ref 145–400)
RBC: 4.8 10*6/uL (ref 4.20–5.70)
RDW: 13.5 % (ref 11.1–15.7)
WBC: 7.8 10*3/uL (ref 4.0–10.0)

## 2016-04-22 MED ORDER — RIVAROXABAN 10 MG PO TABS: 10.0000 mg | ORAL_TABLET | Freq: Every day | ORAL | 3 refills | Status: AC

## 2016-04-22 NOTE — Telephone Encounter (Signed)
Pt dropped off document for PCP to have in the pt's file. (document is Living Will- it is together with spouse Living Will together in a white envelope). Document put at the front office tray.

## 2016-04-22 NOTE — Progress Notes (Signed)
Hematology and Oncology Follow Up Visit  Allen Jones [id number] 1941-01-01 75 y.o. 04/22/2016   Principle Diagnosis:  DVT of the right leg Positive lupus anticoagulant   Current Therapy:   Observation    Interim History:  Mr. Allen Jones is here today with his wife for follow-up. He had an US of the right leg today which showed no significant change to the DVT involving the right femoral and popliteal veins. Also, the radiologist could not exclude some thrombus within the right calf veins. He is currently not on any anticoagulation or aspirin.  He is taking Celebrex 200 mg PO BID. He feels that when he is off of this medication his quality of life suffers drastically due to musculoskeletal pain.  He has occasional right ankle swelling if he is on his feet for an extended period of time.  The neuropathy in his hands and feet is unchanged.  He will be seeing GI next week to have his esophagus stretched. He has had some issues with congestion.  No fever, chills, n/v, cough, rash, dizziness, chest pain, palpitations, abdominal pain or changes in bowel or bladder habits. He takes Miralax daily to help prevent constipation.  He has some occasional SOB and fatigue at times that resolves with taking a moment to rest.  He has maintained a good appetite and is trying his best to stay hydrated. His weight is stable. He states that his blood sugars are well controlled on Glipizide. Hgb A1c in October was 6.4.   Medications:    Medication List       Accurate as of 04/22/16  1:27 PM. Always use your most recent med list.          benzonatate 100 MG capsule Commonly known as:  TESSALON Take 1 capsule (100 mg total) by mouth 3 (three) times daily as needed for cough.   calcium-vitamin D 500-200 MG-UNIT tablet Commonly known as:  OSCAL WITH D Take 1 tablet by mouth 2 (two) times daily.   celecoxib 100 MG capsule Commonly known as:  CELEBREX Take 2 capsules (200 mg total) by mouth 2 (two)  times daily.   fluticasone 50 MCG/ACT nasal spray Commonly known as:  FLONASE USE 1 SPRAY INTO BOTH NOSTRILS 2 TIMES DAILY   glipiZIDE 2.5 MG 24 hr tablet Commonly known as:  GLIPIZIDE XL TAKE 1 BY MOUTH DAILY WITH BREAKFAST   hydrochlorothiazide 12.5 MG tablet Commonly known as:  HYDRODIURIL TAKE 1 BY MOUTH DAILY   hydroxypropyl methylcellulose / hypromellose 2.5 % ophthalmic solution Commonly known as:  ISOPTO TEARS / GONIOVISC 1 drop.   ketoconazole 2 % cream Commonly known as:  NIZORAL Apply topically daily.   olmesartan 40 MG tablet Commonly known as:  BENICAR Take 1 tablet (40 mg total) by mouth daily.   omeprazole 40 MG capsule Commonly known as:  PRILOSEC Take 40 mg by mouth 2 (two) times daily.   temazepam 15 MG capsule Commonly known as:  RESTORIL TAKE TWO CAPSULES BY MOUTH AT BEDTIME AS NEEDED FOR SLEEP   traMADol 50 MG tablet Commonly known as:  ULTRAM Take 1 tablet (50 mg total) by mouth every 8 (eight) hours as needed.       Allergies:  Allergies  Allergen Reactions  . Benadryl [Diphenhydramine Hcl]     hyper  . Fish Oil     Due to total knee   . Percocet [Oxycodone-Acetaminophen]     Hallucinations  . Tetrahydrozoline Hcl     Pink eye  .  Vicodin [Hydrocodone-Acetaminophen]     High BP    Past Medical History, Surgical history, Social history, and Family History were reviewed and updated.  Review of Systems: All other 10 point review of systems is negative.   Physical Exam:  vitals were not taken for this visit.  Wt Readings from Last 3 Encounters:  03/20/16 181 lb 12.8 oz (82.5 kg)  01/19/16 179 lb (81.2 kg)  11/17/15 181 lb (82.1 kg)    Ocular: Sclerae unicteric, pupils equal, round and reactive to light Ear-nose-throat: Oropharynx clear, dentition fair Lymphatic: No cervical supraclavicular or axillary adenopathy Lungs no rales or rhonchi, good excursion bilaterally Heart regular rate and rhythm, no murmur appreciated Abd  soft, nontender, positive bowel sounds, no liver or spleen tip palpated on exam, no fluid wave MSK no focal spinal tenderness, no joint edema Neuro: non-focal, well-oriented, appropriate affect Breasts: Deferred  Lab Results  Component Value Date   WBC 7.8 04/22/2016   HGB 15.4 04/22/2016   HCT 44.1 04/22/2016   MCV 92 04/22/2016   PLT 183 04/22/2016   No results found for: FERRITIN, IRON, TIBC, UIBC, IRONPCTSAT Lab Results  Component Value Date   RBC 4.80 04/22/2016   No results found for: KPAFRELGTCHN, LAMBDASER, KAPLAMBRATIO No results found for: IGGSERUM, IGA, IGMSERUM No results found for: Dorene ArOTALPROTELP, ALBUMINELP, A1GS, A2GS, Colin BentonBETS, BETA2SER, GAMS, MSPIKE, SPEI   Chemistry      Component Value Date/Time   NA 140 01/19/2016 1120   K 4.4 01/19/2016 1120   CL 106 04/17/2015 1449   CO2 22 01/19/2016 1120   BUN 28.1 (H) 01/19/2016 1120   CREATININE 1.9 (H) 01/19/2016 1120      Component Value Date/Time   CALCIUM 9.5 01/19/2016 1120   ALKPHOS 88 01/19/2016 1120   AST 14 01/19/2016 1120   ALT 13 01/19/2016 1120   BILITOT 0.41 01/19/2016 1120     Impression and Plan: Mr. Allen Jones is a 75 yo white male with an extensive thrombus in the right leg. He was positive for the lupus anticoagulant. He was taken off of Xarelto in August of this year and his US today showed no significant change to the DVT involving the right femoral and popliteal veins. Also, the radiologist could not exclude some thrombus within the right calf veins. We will have him restart his Xarelto at 10 mg PO daily. He states that he does not want to stop taking his Celebrex. I spoke with Dr. Myna Jones and we will just continue to monitor him. He can take Celebrex 200 mg po daily with food.  They know to watch for s/s of bleeding and anemia.  We will plan to see him back in 4 months for repeat lab work, follow-up and will repeat an US of the right leg.  He will contact us with any questions or concerns. We can  certainly see him sooner if need be.   Verdie MosherINCINNATI,Allen Nicklin M, NP 11/20/20171:27 PM

## 2016-04-23 ENCOUNTER — Encounter: Payer: Self-pay | Admitting: Hematology & Oncology

## 2016-04-23 ENCOUNTER — Telehealth: Payer: Self-pay | Admitting: Emergency Medicine

## 2016-04-23 LAB — LUPUS ANTICOAGULANT PANEL
PTT-LA: 39.4 s (ref 0.0–51.9)
dRVVT: 46.6 s (ref 0.0–47.0)

## 2016-04-23 NOTE — Telephone Encounter (Signed)
Received Health Care Power of Attorney and Living Will forms from patient. Forms sent to be scanned.

## 2016-08-11 IMAGING — US US EXTREM LOW VENOUS*R*
1 series · 13 of 24 positions shown · non-contrast
Comparison: 07/20/2015

CLINICAL DATA: Followup right lower extremity deep venous
thrombosis



[Series 1: us extrem low venous*right* · 0.08mm/px · 13 of 33 slices shown]
[im 1/33]
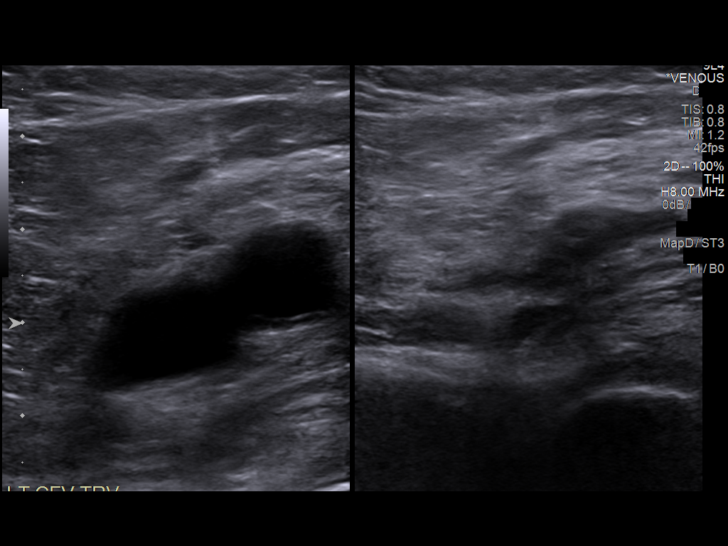
[im 3/33]
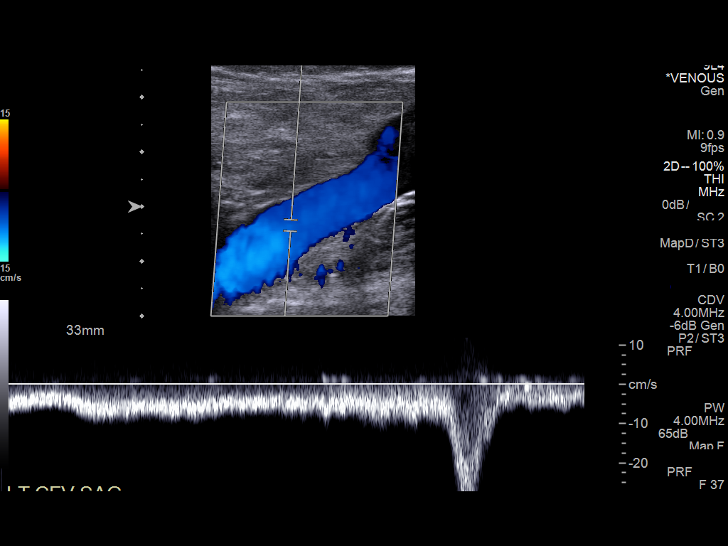
[im 6/33]
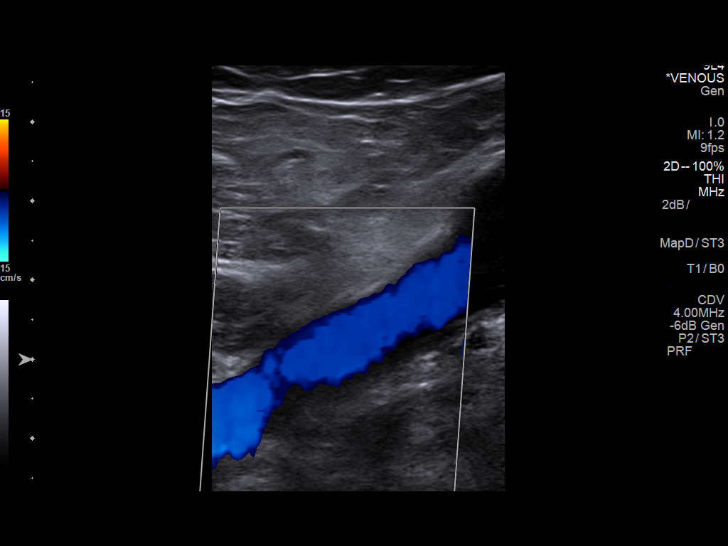
[im 9/33]
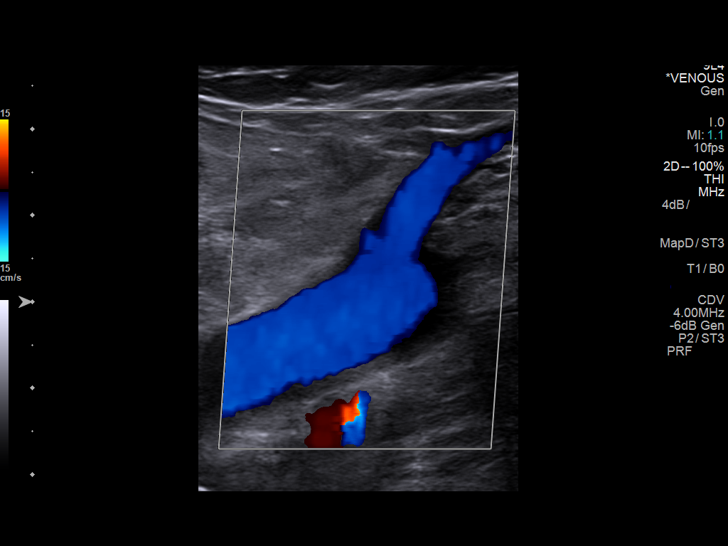
[im 12/33]
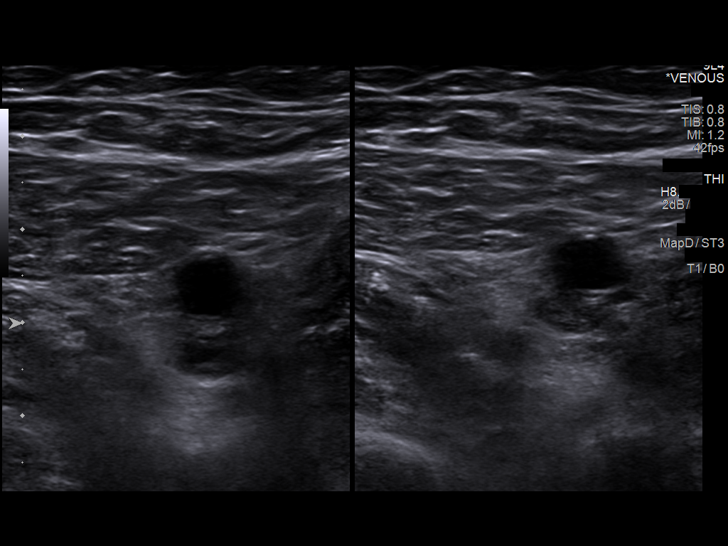
[im 14/33]
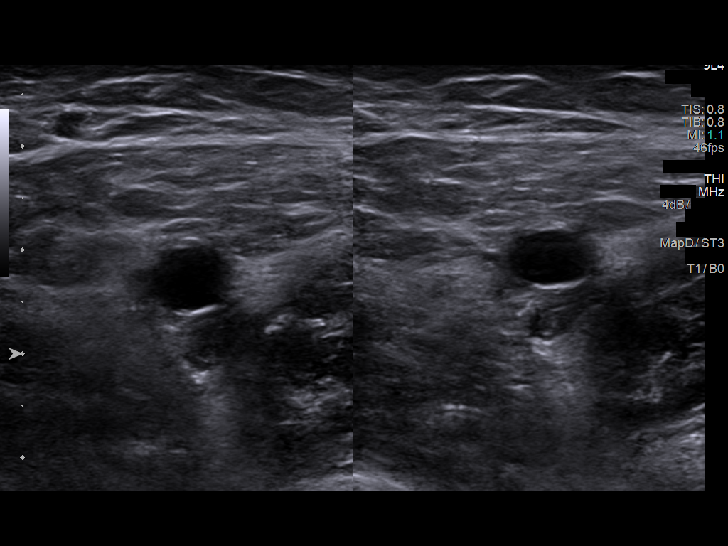
[im 17/33]
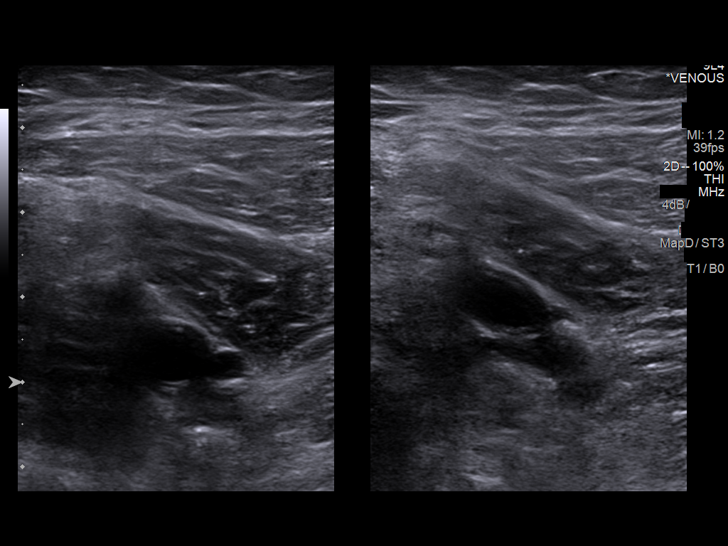
[im 19/33]
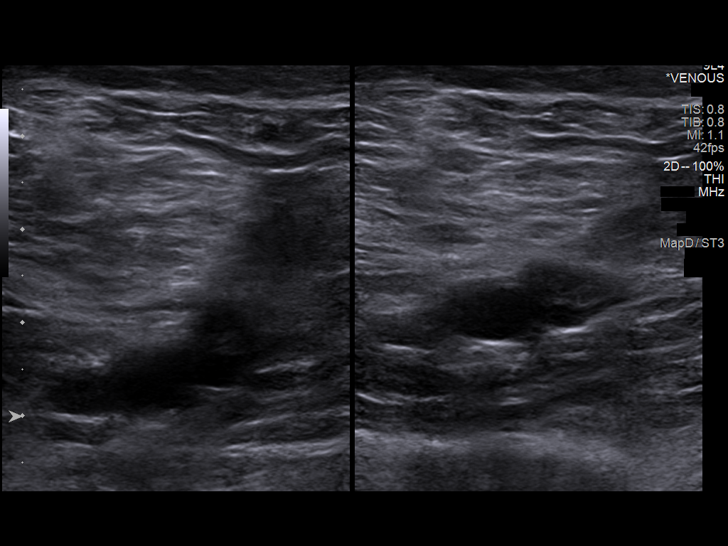
[im 21/33]
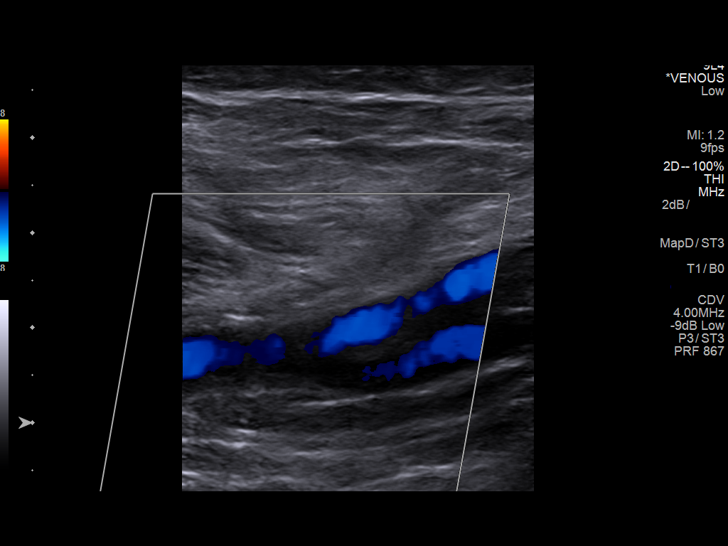
[im 24/33]
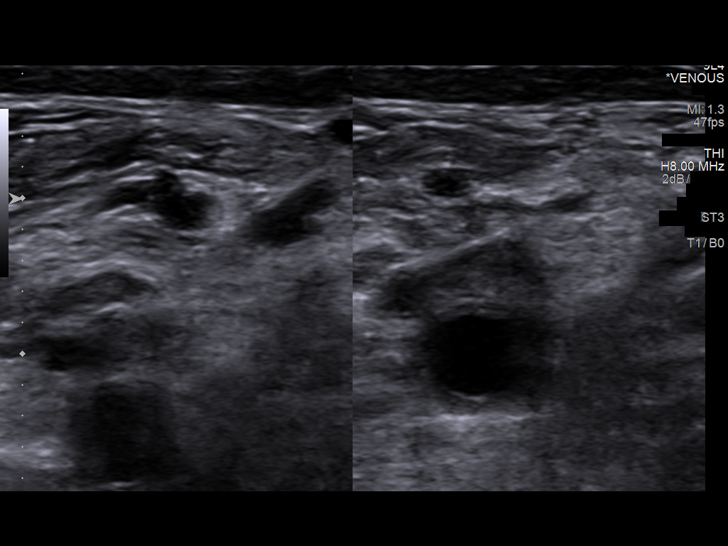
[im 27/33]
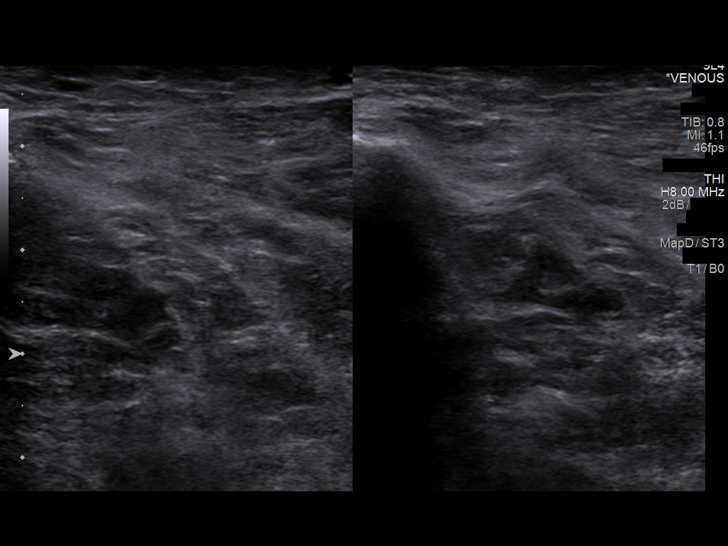
[im 30/33]
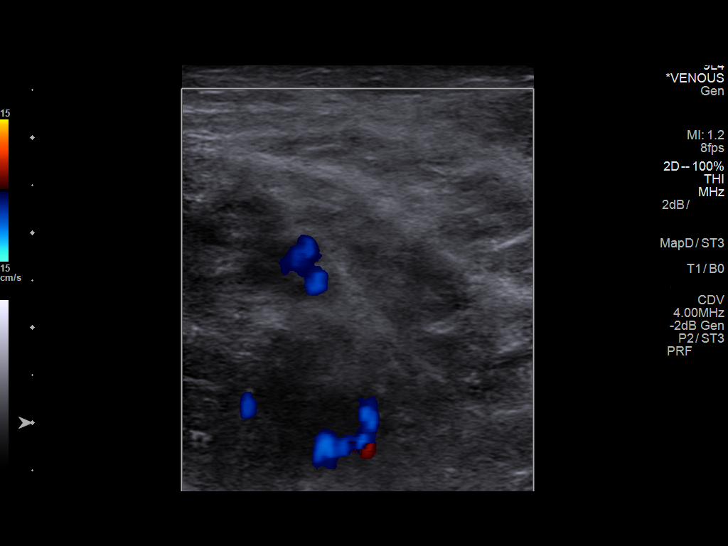
[im 33/33]
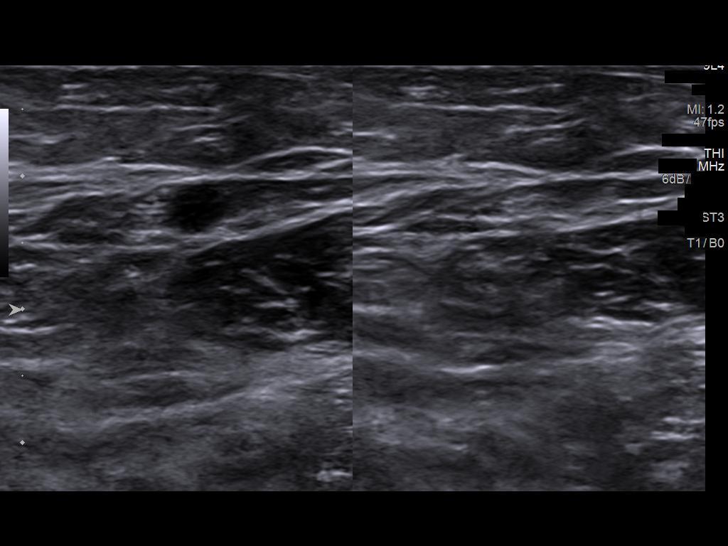

[13 of 24 positions shown; findings below may reference images not displayed]

FINDINGS: Contralateral Common Femoral Vein: Respiratory phasicity is normal
and symmetric with the symptomatic side. No evidence of thrombus.
Normal compressibility.

Common Femoral Vein: Minimal thrombus is noted. Adequate color flow
is seen. Normal compressibility is noted.

Saphenofemoral Junction: No evidence of thrombus. Normal
compressibility and flow on color Doppler imaging.

Profunda Femoral Vein: No evidence of thrombus. Normal
compressibility and flow on color Doppler imaging.

Femoral Vein: Thrombus is noted with decreased compressibility some
minimal color flow is noted.

Popliteal Vein: Thrombus is noted with decreased compressibility.
Minimal color flow is noted.

Calf Veins: Peroneal and posterior tibial thrombus is noted as well.

Superficial Great Saphenous Vein: No evidence of thrombus. Normal
compressibility and flow on color Doppler imaging.

Venous Reflux:  None.

Other Findings:  None.
IMPRESSION: Continued improvement in common femoral thrombus with only minimal
adherent thrombus noted. The saphenous femoral junction is now
patent.

Persistent thrombus within the superficial femoral vein, popliteal
vein and calf veins is seen.

## 2016-08-14 ENCOUNTER — Ambulatory Visit (HOSPITAL_BASED_OUTPATIENT_CLINIC_OR_DEPARTMENT_OTHER): Payer: Medicare Other

## 2016-08-14 ENCOUNTER — Ambulatory Visit (HOSPITAL_BASED_OUTPATIENT_CLINIC_OR_DEPARTMENT_OTHER)
Admission: RE | Admit: 2016-08-14 | Discharge: 2016-08-14 | Disposition: A | Discharge status: Home / Self Care | Payer: Medicare Other | Source: Ambulatory Visit | Attending: Family | Admitting: Family

## 2016-08-14 DIAGNOSIS — I82511 Chronic embolism and thrombosis of right femoral vein: Secondary | ICD-10-CM | POA: Diagnosis present

## 2016-08-14 DIAGNOSIS — R760 Raised antibody titer: Secondary | ICD-10-CM | POA: Diagnosis present

## 2016-08-15 ENCOUNTER — Other Ambulatory Visit: Payer: Self-pay | Admitting: Emergency Medicine

## 2016-08-15 ENCOUNTER — Encounter: Payer: Self-pay | Admitting: Family Medicine

## 2016-08-15 ENCOUNTER — Telehealth: Payer: Self-pay | Admitting: *Deleted

## 2016-08-15 MED ORDER — TEMAZEPAM 15 MG PO CAPS: ORAL_CAPSULE | ORAL | 1 refills | Status: AC

## 2016-08-15 NOTE — Telephone Encounter (Signed)
[facility] for Temazepam oral Capsule 15 MG. Last office visit 03/20/16 and last refill 02/13/16. Is it ok to refill? Please advise.

## 2016-08-15 NOTE — Telephone Encounter (Addendum)
Patient's wife is aware of results. Patient is currently off Xarelto. He refuses to take it because he wants to stay on Celebrex. He would not take instruction on Xarelto. He wants quality of life over quantity and will not stop Celebrex. He has an appointment on Monday with Dr Myna HidalgoEnnever and he will discuss hit more then.   ----- Message from Verdie MosherSarah M [place], NP sent at 08/14/2016  9:16 PM EDT ----- Regarding: DVT DVT is a little worse. Is he taking his xarelto?? If so, he needs to go back to 20 mg PO daily. Please let me know what his current regimen is and we will change his script accordingly.   Sarah   ----- Message ----- From: Leory PlowmanInterface, Rad Results In Sent: 08/14/2016   5:35 PM To: Verdie MosherSarah M [place], NP

## 2016-08-15 NOTE — Telephone Encounter (Signed)
Request for restoril-  [facility]: fill of temazepam given on 12/12- 90 day supply

## 2016-08-19 ENCOUNTER — Other Ambulatory Visit: Payer: Medicare Other

## 2016-08-19 ENCOUNTER — Ambulatory Visit: Payer: Medicare Other | Admitting: Hematology & Oncology

## 2016-08-19 ENCOUNTER — Telehealth: Payer: Self-pay | Admitting: Hematology & Oncology

## 2016-08-19 NOTE — Telephone Encounter (Signed)
Patient's wife called and cx 08/19/16 apt due to husband coding in [employer].  She stated we was taken away in EMS and she would call back to give details.  Rn was notified

## 2016-08-21 ENCOUNTER — Telehealth: Payer: Self-pay | Admitting: Family Medicine

## 2016-08-21 NOTE — Telephone Encounter (Signed)
Called Allen JohnsKathleen and spoke with her.  [facility]. Shared my condolences

## 2016-08-21 NOTE — Telephone Encounter (Signed)
Caller name:Budzik,Kathleen E Relation to pt: spouse  Call back number: [phone]   Reason for call: Spouse wanted to inform PCP patient passed Tuesday Jul 03, 2016 at 4pm at [facility].

## 2016-08-21 NOTE — Telephone Encounter (Signed)
Provider notified

## 2016-08-28 ENCOUNTER — Other Ambulatory Visit: Payer: Self-pay

## 2016-09-01 DEATH — deceased

## 2016-09-19 ENCOUNTER — Encounter: Payer: Medicare Other | Admitting: Family Medicine

## 2016-10-11 IMAGING — US US EXTREM LOW VENOUS*R*
1 series · 13 of 24 positions shown · non-contrast
Comparison: 11/17/2015 and previous

CLINICAL DATA: Chronic right lower extremity DVT since April 2014.

EXAM:
RIGHT LOWER EXTREMITY VENOUS DOPPLER ULTRASOUND
TECHNIQUE: Gray-scale sonography with compression, as well as color and duplex
ultrasound, were performed to evaluate the deep venous system from
the level of the common femoral vein through the popliteal and
proximal calf veins.

[Series 1: us extrem low venous*right* · 0.08mm/px · 13 of 27 slices shown]
[im 1/27]
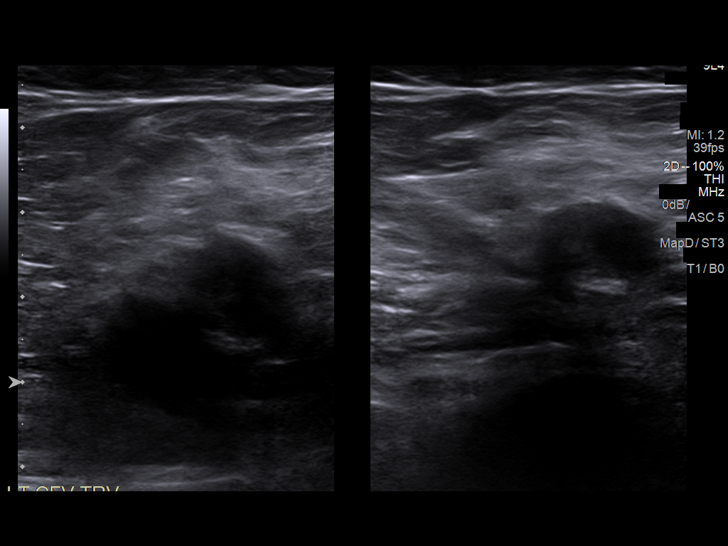
[im 3/27]
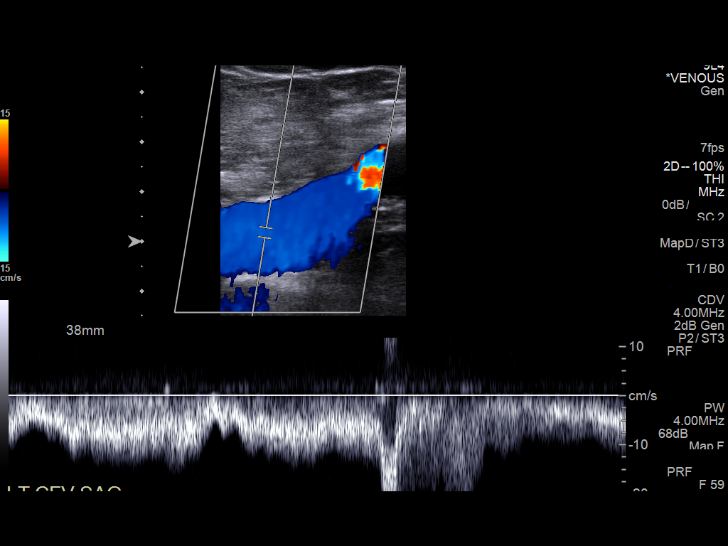
[im 5/27]
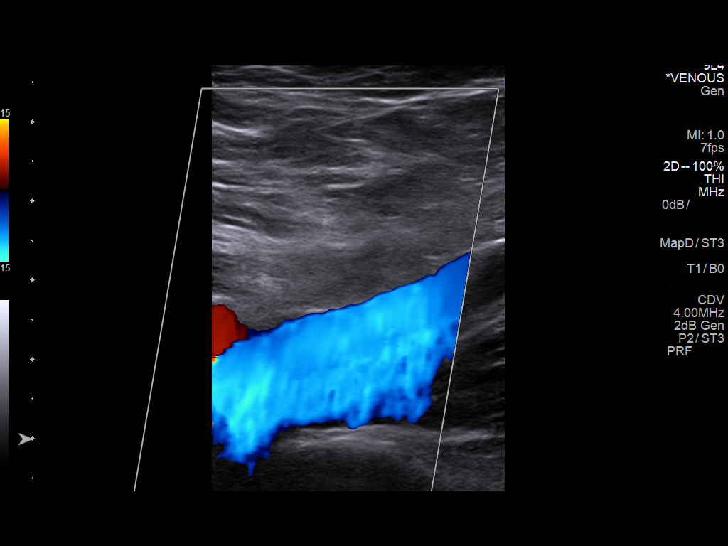
[im 7/27]
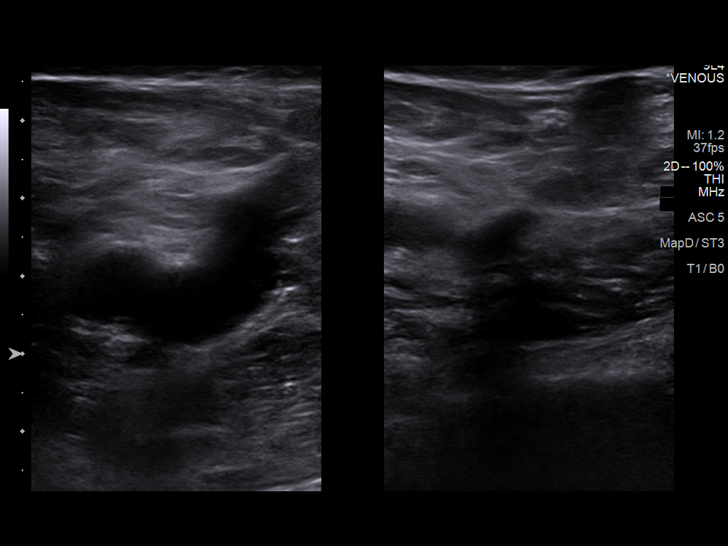
[im 10/27]
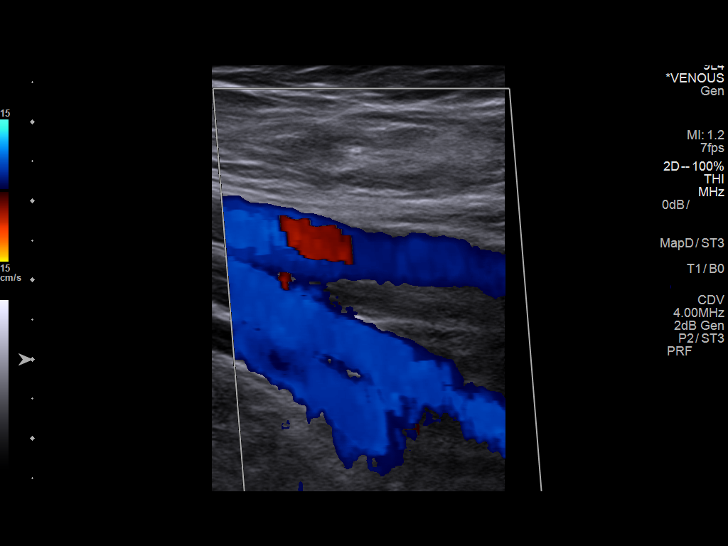
[im 12/27]
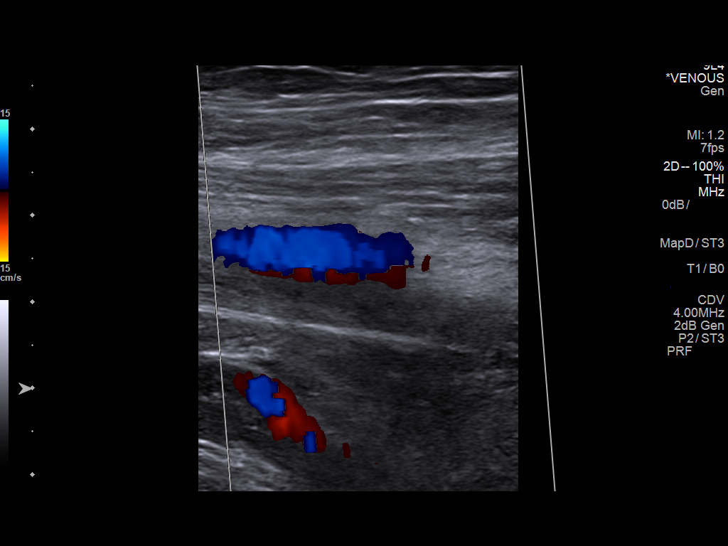
[im 14/27]
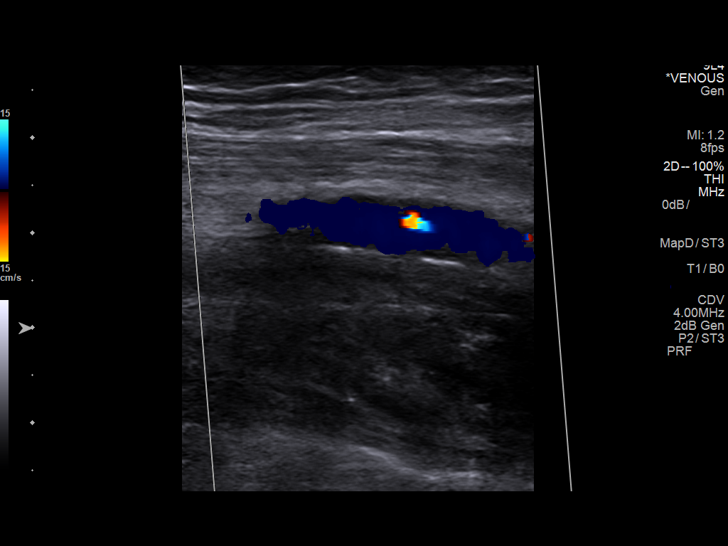
[im 15/27]
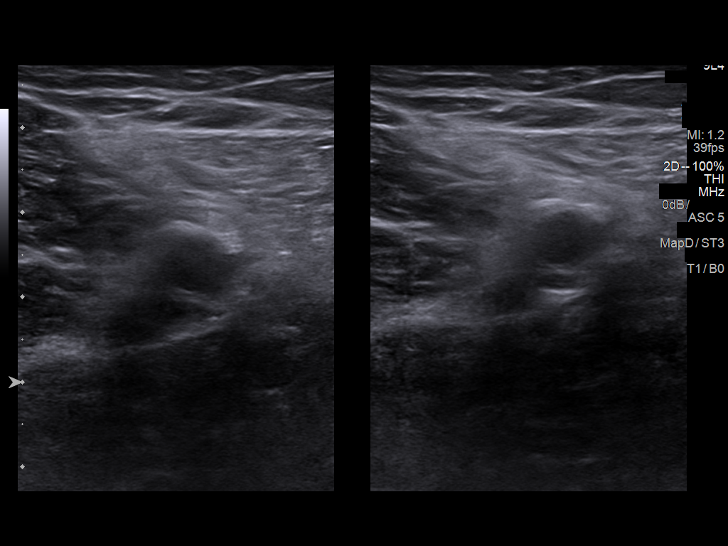
[im 17/27]
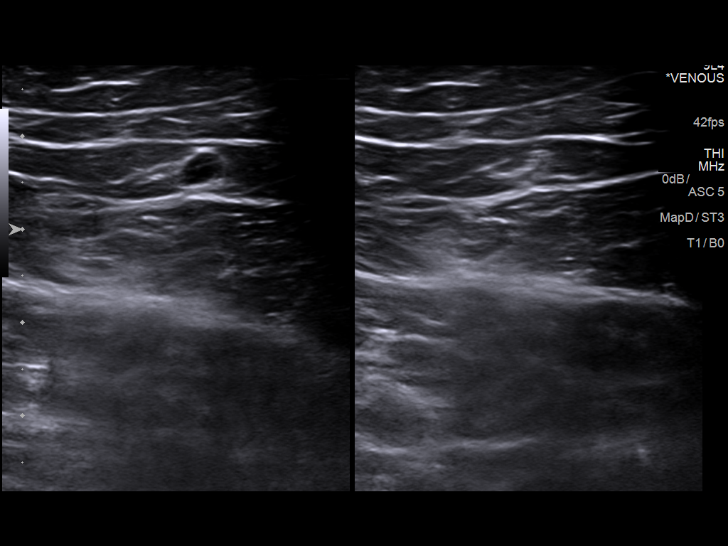
[im 20/27]
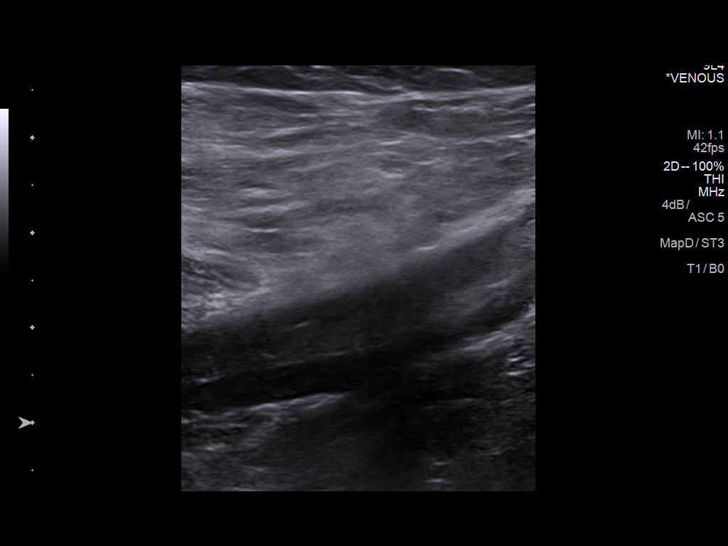
[im 22/27]
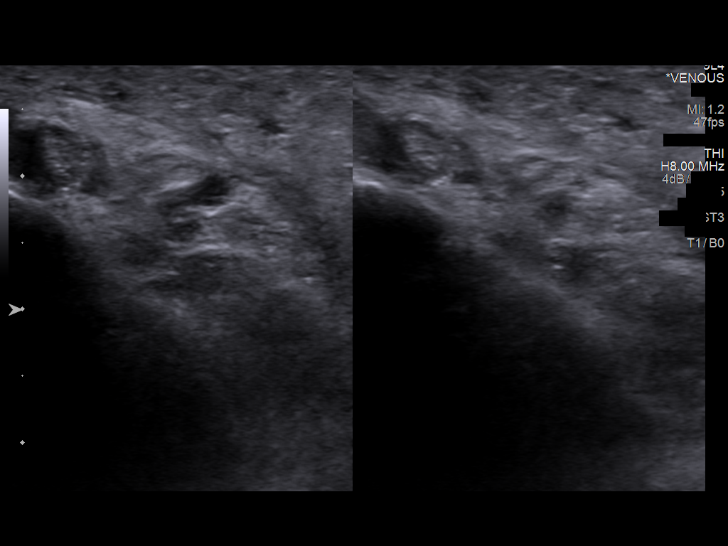
[im 24/27]
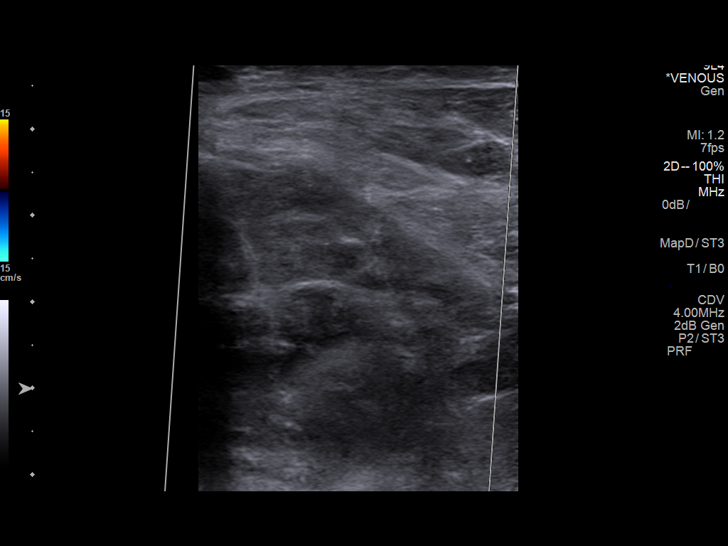
[im 27/27]
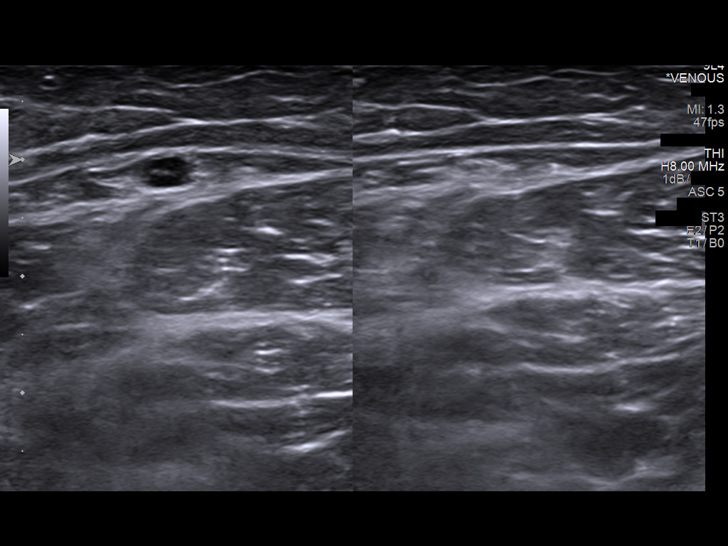

[13 of 24 positions shown; findings below may reference images not displayed]

FINDINGS: Contralateral Common Femoral Vein: Respiratory phasicity is normal
and symmetric with the symptomatic side. No evidence of thrombus.
Normal compressibility.

Common Femoral Vein: Minimal thrombus is noted. Adequate color flow
is seen. Normal compressibility is noted.

Saphenofemoral Junction: No evidence of thrombus. Normal
compressibility and flow on color Doppler imaging.

Profunda Femoral Vein: No evidence of thrombus. Normal
compressibility and flow on color Doppler imaging.

Femoral Vein: Partially Occlusive thrombus with associated
incomplete compressibility in the midportion.

Popliteal Vein: Incompletely compressible with partially occlusive
thrombus.
Calf Veins: Peroneal and posterior tibial thrombus is noted as well.

Superficial Great Saphenous Vein: No evidence of thrombus. Normal
compressibility and flow on color Doppler imaging.

Venous Reflux: None.
IMPRESSION: 1. Little change in partially occlusive chronic right popliteal and
femoral DVT. No interval propagation or progression since previous
exam.

## 2017-05-09 IMAGING — US US EXTREM LOW VENOUS*R*
1 series · 13 of 24 positions shown · non-contrast
Comparison: 04/17/2016, 01/17/2016, 11/17/2015 and 07/20/2015.

CLINICAL DATA: Followup of right lower extremity DVT.



[Series 1: us extrem low venous*right* · 0.09mm/px · 25 acquisitions, 13 frames shown]
[im 1/25]
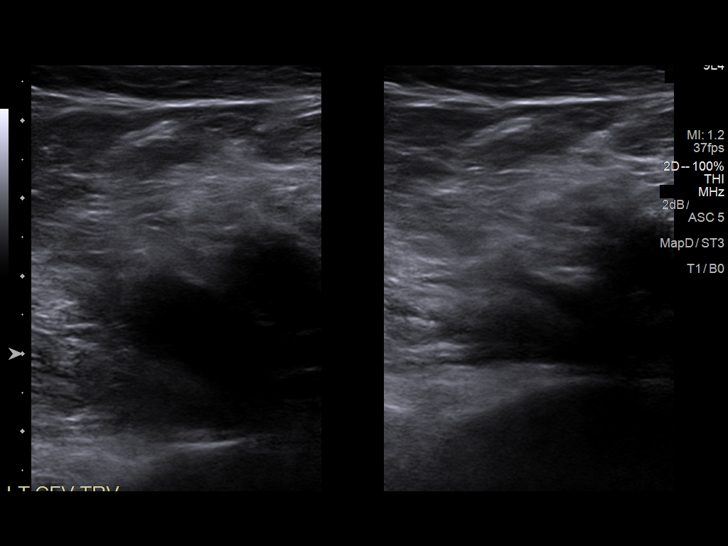
[im 3/25]
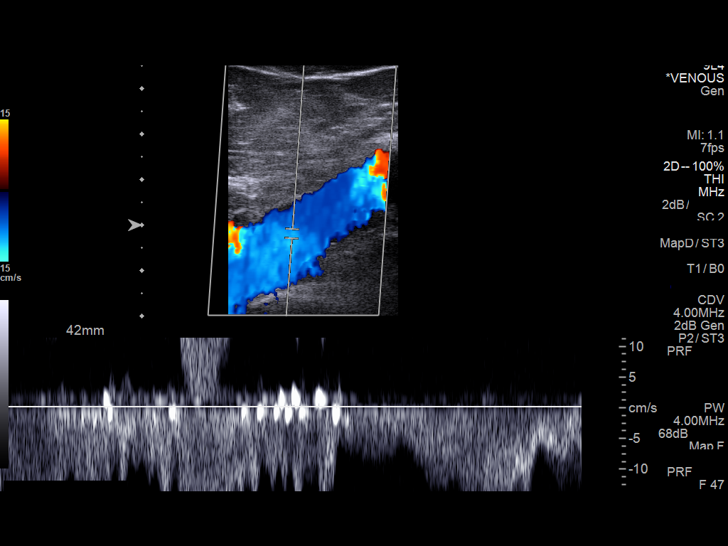
[im 5/25]
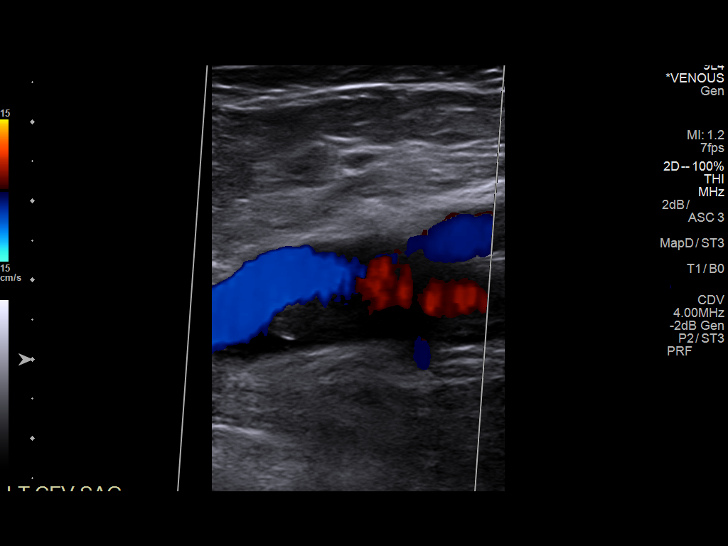
[im 7/25]
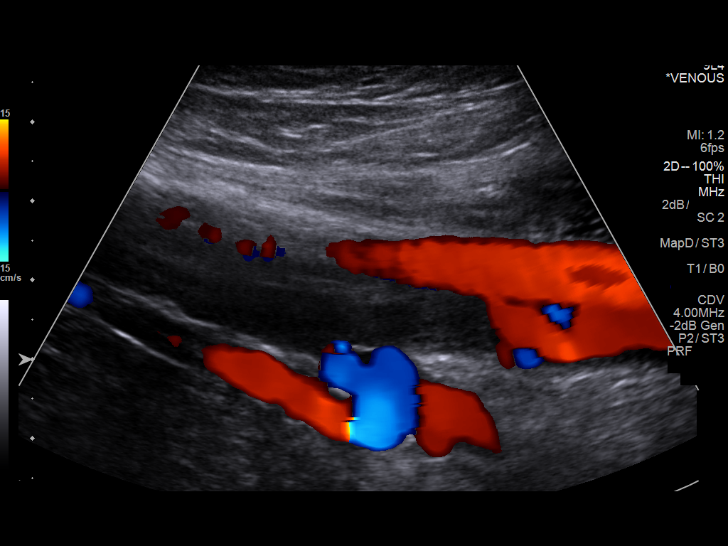
[im 9/25]
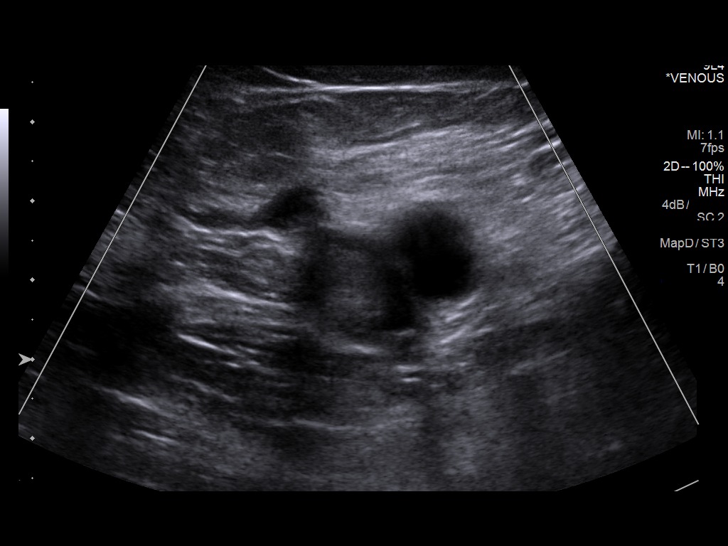
[im 11/25]
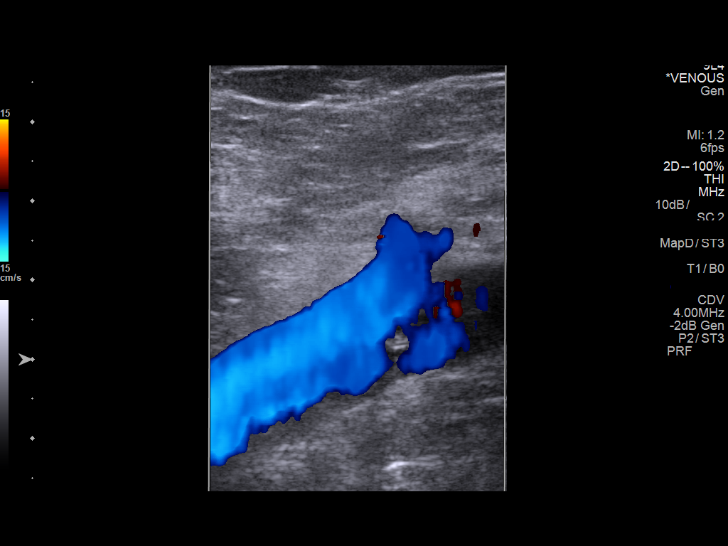
[im 14/25]
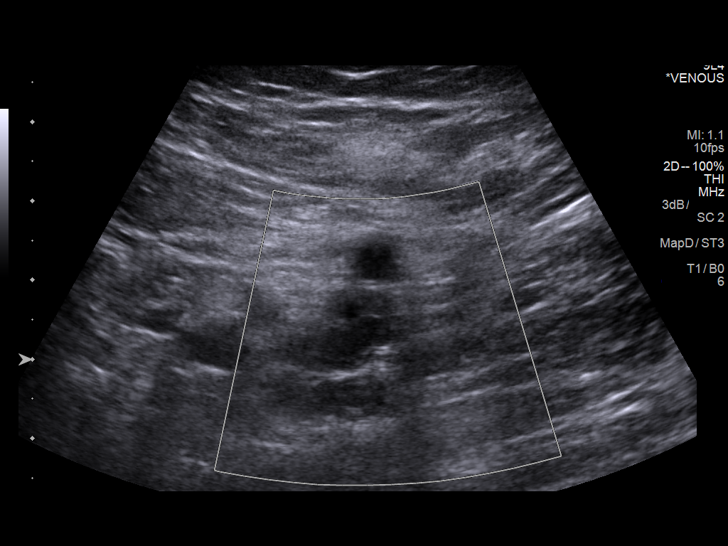
[im 15/25]
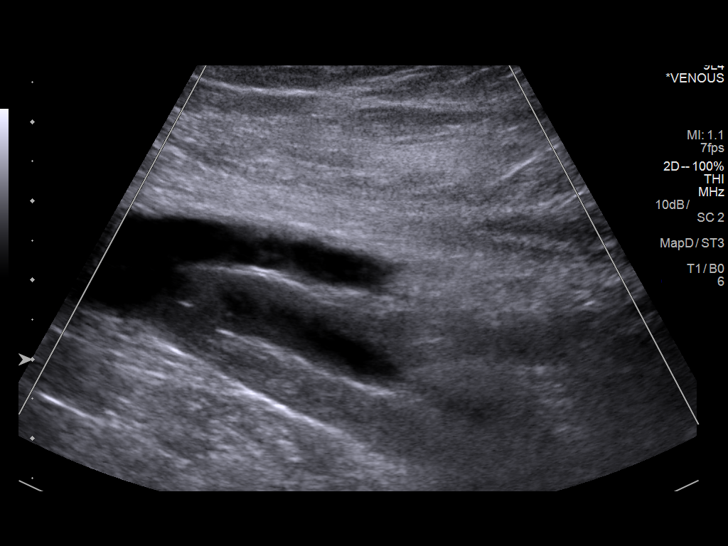
[im 17/25]
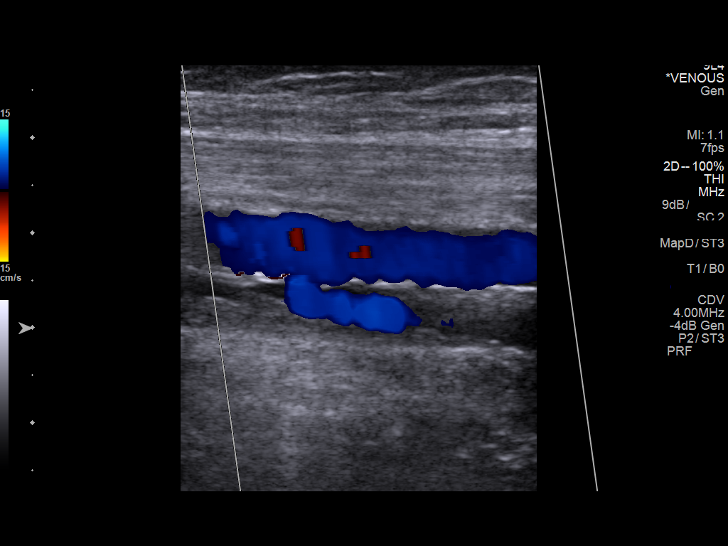
[im 19/25]
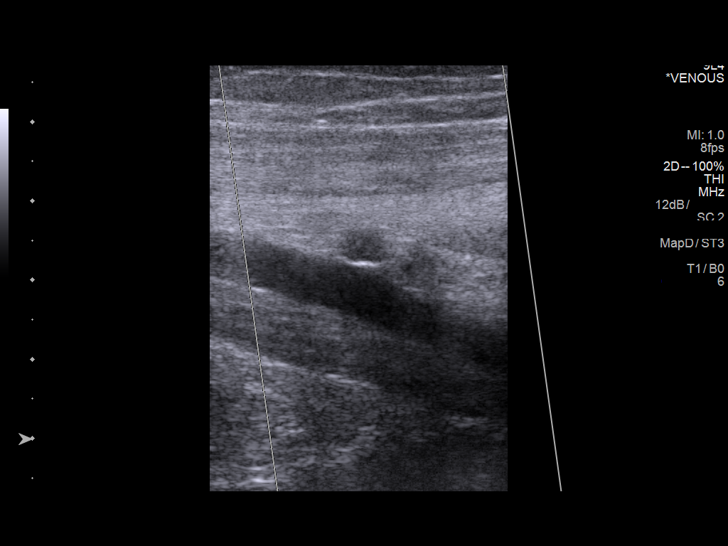
[im 21/25]
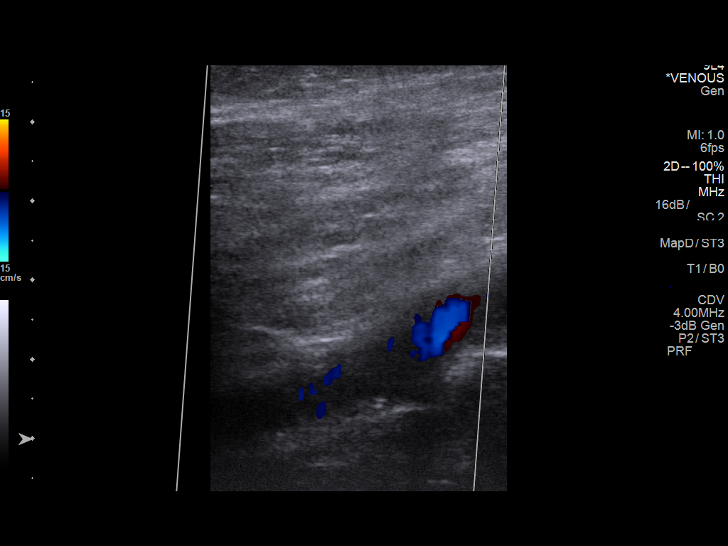
[im 23/25]
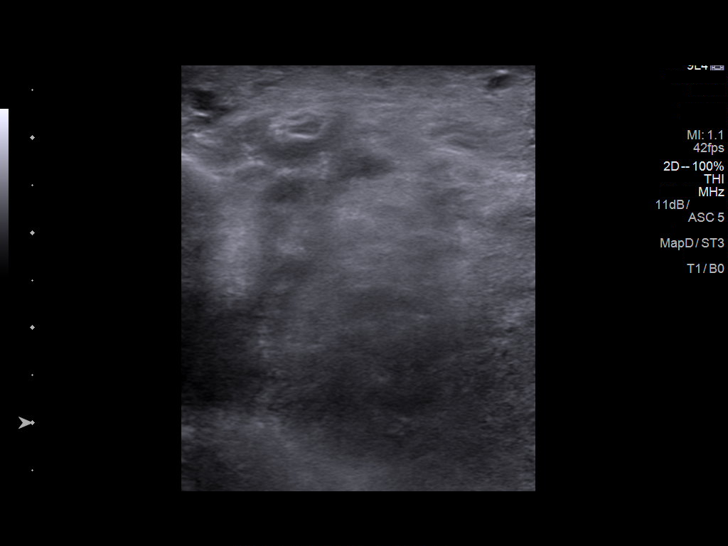
[im 25/25]
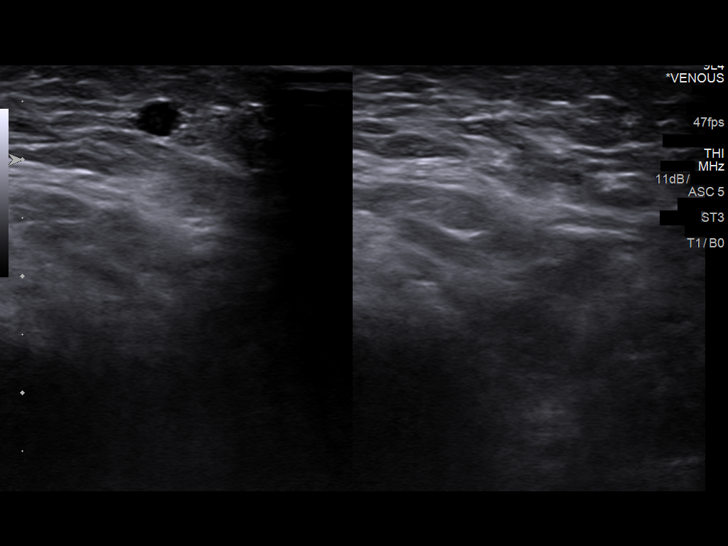

[13 of 24 positions shown; findings below may reference images not displayed]

FINDINGS: Contralateral Common Femoral Vein: Respiratory phasicity is normal
and symmetric with the symptomatic side. No evidence of thrombus.
Normal compressibility.

Contralateral upper leg: There is note made of some thrombus at the
saphenofemoral junction. As it is followed inferiorly, thrombus is
present in the left femoral and profunda femoral veins.

Common Femoral Vein: Nonocclusive thrombus present.

Saphenofemoral Junction: Nonocclusive thrombus present.

Profunda Femoral Vein: Occlusive thrombus present.

Femoral Vein: Occlusive thrombus present.

Popliteal Vein: Nearly occlusive thrombus present.

Calf Veins: No evidence of thrombus. Normal compressibility and flow
on color Doppler imaging.

Superficial Great Saphenous Vein: No evidence of thrombus. Normal
compressibility and flow on color Doppler imaging.

Venous Reflux:  None.

Other Findings:  None.
IMPRESSION: 1. Since the prior study, there may be some propagation of right
lower extremity DVT into the common femoral vein which was not noted
on the prior exam. Common femoral and saphenofemoral thrombus
appears nonocclusive. Chronic occlusive thrombus remains present in
the femoral and popliteal veins.
2. Note was made of some visualized thrombus at the contralateral
left saphenofemoral junction and in the upper leg in the femoral and
profunda femoral veins. The entire left lower extremity was not
examined on the current study.

## 2018-09-14 ENCOUNTER — Telehealth: Payer: Self-pay

## 2018-09-14 NOTE — Telephone Encounter (Signed)
Yes please

## 2018-09-14 NOTE — Telephone Encounter (Signed)
-----   Message from [facility] sent at 09/10/2018 11:52 AM EDT ----- Pt passed away 09/01/16 at [facility].

## 2018-09-14 NOTE — Telephone Encounter (Signed)
Mark deceased in chart?

## 2018-09-15 NOTE — Telephone Encounter (Signed)
Changed to deceased
